# Patient Record
Sex: Female | Born: 1940 | Race: White | Hispanic: No | Marital: Married | Smoking: Former smoker
Health system: Southern US, Community
[De-identification: ages and names within clinical notes are randomized; demographics above are authoritative.]

## PROBLEM LIST (undated history)

## (undated) DIAGNOSIS — K5900 Constipation, unspecified: Secondary | ICD-10-CM

## (undated) DIAGNOSIS — IMO0001 Reserved for inherently not codable concepts without codable children: Secondary | ICD-10-CM

## (undated) DIAGNOSIS — C539 Malignant neoplasm of cervix uteri, unspecified: Secondary | ICD-10-CM

## (undated) DIAGNOSIS — H9190 Unspecified hearing loss, unspecified ear: Secondary | ICD-10-CM

## (undated) DIAGNOSIS — R3129 Other microscopic hematuria: Secondary | ICD-10-CM

## (undated) DIAGNOSIS — Z889 Allergy status to unspecified drugs, medicaments and biological substances status: Secondary | ICD-10-CM

## (undated) DIAGNOSIS — E039 Hypothyroidism, unspecified: Secondary | ICD-10-CM

## (undated) DIAGNOSIS — R413 Other amnesia: Secondary | ICD-10-CM

## (undated) DIAGNOSIS — K219 Gastro-esophageal reflux disease without esophagitis: Secondary | ICD-10-CM

## (undated) DIAGNOSIS — N200 Calculus of kidney: Secondary | ICD-10-CM

## (undated) DIAGNOSIS — I10 Essential (primary) hypertension: Secondary | ICD-10-CM

## (undated) DIAGNOSIS — Z8669 Personal history of other diseases of the nervous system and sense organs: Secondary | ICD-10-CM

## (undated) HISTORY — PX: OTHER SURGICAL HISTORY: SHX169

## (undated) HISTORY — PX: KIDNEY STONE SURGERY: SHX686

## (undated) HISTORY — PX: VAGINAL HYSTERECTOMY: SUR661

---

## 2002-10-31 HISTORY — PX: BREAST CYST ASPIRATION: SHX578

## 2002-10-31 HISTORY — PX: BREAST BIOPSY: SHX20

## 2005-01-20 ENCOUNTER — Ambulatory Visit: Payer: Self-pay | Admitting: Neurology

## 2005-01-25 ENCOUNTER — Encounter: Payer: Self-pay | Admitting: Neurology

## 2005-01-29 ENCOUNTER — Encounter: Payer: Self-pay | Admitting: Neurology

## 2005-02-08 ENCOUNTER — Ambulatory Visit: Payer: Self-pay | Admitting: Internal Medicine

## 2006-03-08 ENCOUNTER — Ambulatory Visit: Payer: Self-pay | Admitting: Internal Medicine

## 2006-03-30 ENCOUNTER — Ambulatory Visit: Payer: Self-pay | Admitting: Unknown Physician Specialty

## 2006-06-29 ENCOUNTER — Ambulatory Visit: Payer: Self-pay

## 2006-07-19 ENCOUNTER — Ambulatory Visit: Payer: Self-pay | Admitting: Unknown Physician Specialty

## 2007-02-27 ENCOUNTER — Ambulatory Visit: Payer: Self-pay

## 2007-04-05 ENCOUNTER — Ambulatory Visit: Payer: Self-pay | Admitting: Surgery

## 2007-12-19 ENCOUNTER — Ambulatory Visit: Payer: Self-pay | Admitting: Unknown Physician Specialty

## 2008-01-23 ENCOUNTER — Ambulatory Visit: Payer: Self-pay | Admitting: Unknown Physician Specialty

## 2008-03-04 ENCOUNTER — Ambulatory Visit: Payer: Self-pay | Admitting: Internal Medicine

## 2008-06-21 ENCOUNTER — Emergency Department: Payer: Self-pay

## 2009-01-28 ENCOUNTER — Ambulatory Visit: Payer: Self-pay | Admitting: Unknown Physician Specialty

## 2009-04-22 ENCOUNTER — Ambulatory Visit: Payer: Self-pay | Admitting: Obstetrics and Gynecology

## 2009-08-07 ENCOUNTER — Ambulatory Visit: Payer: Self-pay | Admitting: Internal Medicine

## 2010-05-24 ENCOUNTER — Ambulatory Visit: Payer: Self-pay | Admitting: Internal Medicine

## 2010-07-19 ENCOUNTER — Ambulatory Visit: Payer: Self-pay | Admitting: Unknown Physician Specialty

## 2010-11-03 ENCOUNTER — Ambulatory Visit: Payer: Self-pay | Admitting: Urology

## 2011-06-27 ENCOUNTER — Ambulatory Visit: Payer: Self-pay | Admitting: Internal Medicine

## 2011-06-29 ENCOUNTER — Ambulatory Visit: Payer: Self-pay | Admitting: Internal Medicine

## 2011-07-06 ENCOUNTER — Ambulatory Visit: Payer: Self-pay | Admitting: Internal Medicine

## 2011-07-21 ENCOUNTER — Ambulatory Visit: Payer: Self-pay | Admitting: Urology

## 2012-03-13 ENCOUNTER — Other Ambulatory Visit (HOSPITAL_COMMUNITY): Payer: Self-pay | Admitting: Urology

## 2012-03-13 DIAGNOSIS — N19 Unspecified kidney failure: Secondary | ICD-10-CM

## 2012-03-16 ENCOUNTER — Encounter (HOSPITAL_COMMUNITY)
Admission: RE | Admit: 2012-03-16 | Discharge: 2012-03-16 | Disposition: A | Discharge status: Home / Self Care | Payer: Medicare Other | Source: Ambulatory Visit | Attending: Urology | Admitting: Urology

## 2012-03-16 DIAGNOSIS — N19 Unspecified kidney failure: Secondary | ICD-10-CM | POA: Insufficient documentation

## 2012-03-16 DIAGNOSIS — R319 Hematuria, unspecified: Secondary | ICD-10-CM | POA: Insufficient documentation

## 2012-03-16 DIAGNOSIS — R109 Unspecified abdominal pain: Secondary | ICD-10-CM | POA: Insufficient documentation

## 2012-03-16 MED ORDER — FUROSEMIDE 10 MG/ML IJ SOLN
40.0000 mg | Freq: Once | INTRAMUSCULAR | Status: DC
  Filled 2012-03-16: qty 4

## 2012-03-16 MED ORDER — TECHNETIUM TC 99M MERTIATIDE
14.9000 | Freq: Once | INTRAVENOUS | Status: AC | PRN
  Administered 2012-03-16: 14.9 via INTRAVENOUS

## 2012-05-08 ENCOUNTER — Other Ambulatory Visit: Payer: Self-pay | Admitting: Urology

## 2012-05-10 ENCOUNTER — Encounter (HOSPITAL_BASED_OUTPATIENT_CLINIC_OR_DEPARTMENT_OTHER): Payer: Self-pay | Admitting: *Deleted

## 2012-05-10 NOTE — Progress Notes (Signed)
NPO AFTER MN WITH EXCEPTION CLEAR LIQUIDS UNTIL 0800 (NO CREAM/ MILK PRODUCTS). ARRIVES AT 1300. NEEDS ISTAT AND EKG. WILL TAKE RANITIDINE AM OF SURG W/ SIP OF WATER.

## 2012-05-16 ENCOUNTER — Ambulatory Visit (HOSPITAL_BASED_OUTPATIENT_CLINIC_OR_DEPARTMENT_OTHER)
Admission: RE | Admit: 2012-05-16 | Discharge: 2012-05-16 | Disposition: A | Discharge status: Home / Self Care | Payer: Medicare Other | Source: Ambulatory Visit | Attending: Urology | Admitting: Urology

## 2012-05-16 ENCOUNTER — Encounter (HOSPITAL_BASED_OUTPATIENT_CLINIC_OR_DEPARTMENT_OTHER): Payer: Self-pay | Admitting: Anesthesiology

## 2012-05-16 ENCOUNTER — Ambulatory Visit (HOSPITAL_BASED_OUTPATIENT_CLINIC_OR_DEPARTMENT_OTHER): Payer: Medicare Other | Admitting: Anesthesiology

## 2012-05-16 ENCOUNTER — Encounter (HOSPITAL_BASED_OUTPATIENT_CLINIC_OR_DEPARTMENT_OTHER)
Admission: RE | Disposition: A | Discharge status: Home / Self Care | Payer: Self-pay | Source: Ambulatory Visit | Attending: Urology

## 2012-05-16 ENCOUNTER — Encounter (HOSPITAL_BASED_OUTPATIENT_CLINIC_OR_DEPARTMENT_OTHER): Payer: Self-pay | Admitting: *Deleted

## 2012-05-16 DIAGNOSIS — R3129 Other microscopic hematuria: Secondary | ICD-10-CM | POA: Insufficient documentation

## 2012-05-16 DIAGNOSIS — I10 Essential (primary) hypertension: Secondary | ICD-10-CM | POA: Insufficient documentation

## 2012-05-16 DIAGNOSIS — K219 Gastro-esophageal reflux disease without esophagitis: Secondary | ICD-10-CM | POA: Insufficient documentation

## 2012-05-16 DIAGNOSIS — Z90710 Acquired absence of both cervix and uterus: Secondary | ICD-10-CM | POA: Insufficient documentation

## 2012-05-16 DIAGNOSIS — E039 Hypothyroidism, unspecified: Secondary | ICD-10-CM | POA: Insufficient documentation

## 2012-05-16 HISTORY — PX: CYSTOSCOPY W/ RETROGRADES: SHX1426

## 2012-05-16 HISTORY — DX: Hypothyroidism, unspecified: E03.9

## 2012-05-16 HISTORY — DX: Unspecified hearing loss, unspecified ear: H91.90

## 2012-05-16 HISTORY — DX: Personal history of other diseases of the nervous system and sense organs: Z86.69

## 2012-05-16 HISTORY — DX: Gastro-esophageal reflux disease without esophagitis: K21.9

## 2012-05-16 HISTORY — DX: Essential (primary) hypertension: I10

## 2012-05-16 HISTORY — DX: Allergy status to unspecified drugs, medicaments and biological substances: Z88.9

## 2012-05-16 HISTORY — DX: Other microscopic hematuria: R31.29

## 2012-05-16 HISTORY — DX: Other amnesia: R41.3

## 2012-05-16 HISTORY — DX: Constipation, unspecified: K59.00

## 2012-05-16 HISTORY — DX: Reserved for inherently not codable concepts without codable children: IMO0001

## 2012-05-16 SURGERY — CYSTOSCOPY, WITH RETROGRADE PYELOGRAM
Anesthesia: General | Site: Ureter | Laterality: Bilateral | Wound class: Clean Contaminated

## 2012-05-16 MED ORDER — LIDOCAINE HCL 2 % EX GEL
CUTANEOUS | Status: DC | PRN
  Administered 2012-05-16: 1 via URETHRAL

## 2012-05-16 MED ORDER — IOHEXOL 350 MG/ML SOLN
INTRAVENOUS | Status: DC | PRN
  Administered 2012-05-16: 23 mL via INTRAVENOUS

## 2012-05-16 MED ORDER — ONDANSETRON HCL 4 MG/2ML IJ SOLN
INTRAMUSCULAR | Status: DC | PRN
  Administered 2012-05-16: 4 mg via INTRAVENOUS

## 2012-05-16 MED ORDER — DEXAMETHASONE SODIUM PHOSPHATE 4 MG/ML IJ SOLN
INTRAMUSCULAR | Status: DC | PRN
  Administered 2012-05-16: 10 mg via INTRAVENOUS

## 2012-05-16 MED ORDER — PROMETHAZINE HCL 25 MG/ML IJ SOLN: 6.2500 mg | INTRAMUSCULAR | Status: DC | PRN

## 2012-05-16 MED ORDER — PROPOFOL 10 MG/ML IV EMUL
INTRAVENOUS | Status: DC | PRN
  Administered 2012-05-16: 150 mg via INTRAVENOUS

## 2012-05-16 MED ORDER — EPHEDRINE SULFATE 50 MG/ML IJ SOLN
INTRAMUSCULAR | Status: DC | PRN
  Administered 2012-05-16 (×2): 10 mg via INTRAVENOUS

## 2012-05-16 MED ORDER — FENTANYL CITRATE 0.05 MG/ML IJ SOLN: 25.0000 ug | INTRAMUSCULAR | Status: DC | PRN

## 2012-05-16 MED ORDER — PHENAZOPYRIDINE HCL 100 MG PO TABS: 100.0000 mg | ORAL_TABLET | Freq: Three times a day (TID) | ORAL | Status: AC | PRN

## 2012-05-16 MED ORDER — CEPHALEXIN 500 MG PO CAPS: 500.0000 mg | ORAL_CAPSULE | Freq: Three times a day (TID) | ORAL | Status: AC

## 2012-05-16 MED ORDER — LIDOCAINE HCL (CARDIAC) 20 MG/ML IV SOLN
INTRAVENOUS | Status: DC | PRN
  Administered 2012-05-16: 50 mg via INTRAVENOUS

## 2012-05-16 MED ORDER — LACTATED RINGERS IV SOLN
INTRAVENOUS | Status: DC
  Administered 2012-05-16: 13:00:00 via INTRAVENOUS

## 2012-05-16 MED ORDER — HYDROCODONE-ACETAMINOPHEN 5-325 MG PO TABS: 1.0000 | ORAL_TABLET | ORAL | Status: AC | PRN

## 2012-05-16 MED ORDER — KETOROLAC TROMETHAMINE 30 MG/ML IJ SOLN: 15.0000 mg | Freq: Once | INTRAMUSCULAR | Status: DC | PRN

## 2012-05-16 MED ORDER — FENTANYL CITRATE 0.05 MG/ML IJ SOLN
INTRAMUSCULAR | Status: DC | PRN
  Administered 2012-05-16: 25 ug via INTRAVENOUS
  Administered 2012-05-16: 50 ug via INTRAVENOUS
  Administered 2012-05-16: 25 ug via INTRAVENOUS

## 2012-05-16 MED ORDER — STERILE WATER FOR IRRIGATION IR SOLN
Status: DC | PRN
  Administered 2012-05-16: 3000 mL

## 2012-05-16 MED ORDER — BELLADONNA ALKALOIDS-OPIUM 16.2-60 MG RE SUPP
RECTAL | Status: DC | PRN
  Administered 2012-05-16: 1 via RECTAL

## 2012-05-16 MED ORDER — CEFAZOLIN SODIUM 1-5 GM-% IV SOLN
1.0000 g | INTRAVENOUS | Status: AC
  Administered 2012-05-16: 1 g via INTRAVENOUS

## 2012-05-16 MED ORDER — HYOSCYAMINE SULFATE 0.125 MG PO TABS: 0.1250 mg | ORAL_TABLET | ORAL | Status: DC | PRN

## 2012-05-16 SURGICAL SUPPLY — 35 items
ADAPTER CATH URET PLST 4-6FR (CATHETERS) ×2 IMPLANT
BAG DRAIN URO-CYSTO SKYTR STRL (DRAIN) ×2 IMPLANT
BASKET LASER NITINOL 1.9FR (BASKET) IMPLANT
BASKET STNLS GEMINI 4WIRE 3FR (BASKET) IMPLANT
BASKET ZERO TIP NITINOL 2.4FR (BASKET) IMPLANT
BRUSH URET BIOPSY 3F (UROLOGICAL SUPPLIES) IMPLANT
CANISTER SUCT LVC 12 LTR MEDI- (MISCELLANEOUS) ×2 IMPLANT
CATH INTERMIT  6FR 70CM (CATHETERS) IMPLANT
CATH URET 5FR 28IN CONE TIP (BALLOONS)
CATH URET 5FR 28IN OPEN ENDED (CATHETERS) ×2 IMPLANT
CATH URET 5FR 70CM CONE TIP (BALLOONS) IMPLANT
CLOTH BEACON ORANGE TIMEOUT ST (SAFETY) ×2 IMPLANT
DRAPE CAMERA CLOSED 9X96 (DRAPES) ×2 IMPLANT
ELECT REM PT RETURN 9FT ADLT (ELECTROSURGICAL)
ELECTRODE REM PT RTRN 9FT ADLT (ELECTROSURGICAL) IMPLANT
GLOVE BIOGEL M 6.5 STRL (GLOVE) ×2 IMPLANT
GLOVE BIOGEL PI IND STRL 6.5 (GLOVE) ×1 IMPLANT
GLOVE BIOGEL PI INDICATOR 6.5 (GLOVE) ×1
GLOVE ECLIPSE 7.0 STRL STRAW (GLOVE) ×2 IMPLANT
GLOVE INDICATOR 6.5 STRL GRN (GLOVE) ×2 IMPLANT
GLOVE INDICATOR 7.5 STRL GRN (GLOVE) ×2 IMPLANT
GOWN PREVENTION PLUS LG XLONG (DISPOSABLE) ×2 IMPLANT
GUIDEWIRE 0.038 PTFE COATED (WIRE) IMPLANT
GUIDEWIRE ANG ZIPWIRE 038X150 (WIRE) IMPLANT
GUIDEWIRE STR DUAL SENSOR (WIRE) ×2 IMPLANT
IV NS IRRIG 3000ML ARTHROMATIC (IV SOLUTION) ×2 IMPLANT
KIT BALLIN UROMAX 15FX10 (LABEL) IMPLANT
KIT BALLN UROMAX 15FX4 (MISCELLANEOUS) IMPLANT
KIT BALLN UROMAX 26 75X4 (MISCELLANEOUS)
LASER FIBER DISP (UROLOGICAL SUPPLIES) IMPLANT
PACK CYSTOSCOPY (CUSTOM PROCEDURE TRAY) ×2 IMPLANT
SET HIGH PRES BAL DIL (LABEL)
SHEATH URET ACCESS 12FR/35CM (UROLOGICAL SUPPLIES) IMPLANT
SHEATH URET ACCESS 12FR/55CM (UROLOGICAL SUPPLIES) IMPLANT
SYRINGE IRR TOOMEY STRL 70CC (SYRINGE) IMPLANT

## 2012-05-16 NOTE — Transfer of Care (Signed)
Immediate Anesthesia Transfer of Care Note  Patient: Emma Walls  Procedure(s) Performed: Procedure(s) (LRB): CYSTOSCOPY WITH RETROGRADE PYELOGRAM (Bilateral)  Patient Location: PACU  Anesthesia Type: General  Level of Consciousness: awake, alert  and oriented  Airway & Oxygen Therapy: Patient Spontanous Breathing and Patient connected to face mask oxygen  Post-op Assessment: Report given to PACU RN and Post -op Vital signs reviewed and stable  Post vital signs: Reviewed and stable  Complications: No apparent anesthesia complications

## 2012-05-16 NOTE — Progress Notes (Signed)
Istat result K 5.8 Dr. Kalman Shan notified and ok'd lab result.

## 2012-05-16 NOTE — Anesthesia Preprocedure Evaluation (Signed)
Anesthesia Evaluation  Patient identified by MRN, date of birth, ID band Patient awake    Reviewed: Allergy & Precautions, H&P , NPO status , Patient's Chart, lab work & pertinent test results  Airway Mallampati: II TM Distance: <3 FB Neck ROM: Full    Dental No notable dental hx.    Pulmonary neg pulmonary ROS,  breath sounds clear to auscultation  Pulmonary exam normal       Cardiovascular hypertension, Pt. on medications Rhythm:Regular Rate:Normal     Neuro/Psych negative neurological ROS  negative psych ROS   GI/Hepatic Neg liver ROS, GERD-  Medicated,  Endo/Other  negative endocrine ROS  Renal/GU negative Renal ROS  negative genitourinary   Musculoskeletal negative musculoskeletal ROS (+)   Abdominal   Peds negative pediatric ROS (+)  Hematology negative hematology ROS (+)   Anesthesia Other Findings   Reproductive/Obstetrics negative OB ROS                           Anesthesia Physical Anesthesia Plan  ASA: II  Anesthesia Plan: General   Post-op Pain Management:    Induction: Intravenous  Airway Management Planned: LMA  Additional Equipment:   Intra-op Plan:   Post-operative Plan:   Informed Consent: I have reviewed the patients History and Physical, chart, labs and discussed the procedure including the risks, benefits and alternatives for the proposed anesthesia with the patient or authorized representative who has indicated his/her understanding and acceptance.   Dental advisory given  Plan Discussed with: CRNA and Surgeon  Anesthesia Plan Comments:         Anesthesia Quick Evaluation

## 2012-05-16 NOTE — H&P (Signed)
Urology History and Physical Exam  CC: Microscopic hematuria  HPI: 71 year old female presentss today for cystoscopy and right retrograde pyelogram.  On work up for microscopic hematuria her CT from 04/24/12 revealed incomplete opacification of her right distal ureter. The left ureter filled out well. Office cystoscopy in May 2013 was negative for tumors. She had some acute renal insufficiency that seems to be resolving. This was not associated with right hydroureter or right hydronephrosis. We have discussed the risks, benefits, alternatives, and likelihood of achieving goals. There was no nephrolithiasis, hydroureter, or filling defects bilaterally.  PMH: Past Medical History  Diagnosis Date  . Hypertension   . H/O seasonal allergies   . History of migraine   . Mild memory disturbance OCCASIONAL FORGETFULNESS  . Hypothyroidism   . GERD (gastroesophageal reflux disease)   . Constipation   . Microhematuria   . Impaired hearing BILATERAL HEARING AIDS    PSH: Past Surgical History  Procedure Date  . Vaginal hysterectomy AGE 71  . Benign breast bx 2007  (APPROX)    Allergies: Allergies  Allergen Reactions  . Codeine Other (See Comments)    HALLUCINATIONS  . Sulfa Antibiotics Other (See Comments)    UNKNOWN REACTION    Medications: No prescriptions prior to admission     Social History: History   Social History  . Marital Status: Married    Spouse Name: N/A    Number of Children: N/A  . Years of Education: N/A   Occupational History  . Not on file.   Social History Main Topics  . Smoking status: Never Smoker   . Smokeless tobacco: Never Used  . Alcohol Use: Yes     SOCIAL  . Drug Use: No  . Sexually Active:    Other Topics Concern  . Not on file   Social History Narrative  . No narrative on file    Family History: History reviewed. No pertinent family history.  Review of Systems: Positive: None. Negative: Fever, chest pain, SOB.  A further 10 point  review of systems was negative except what is listed in the HPI.  Physical Exam:  General: No acute distress.  Awake. Head:  Normocephalic.  Atraumatic. ENT:  EOMI.  Mucous membranes moist Neck:  Supple.  No lymphadenopathy. CV:  S1 present. S2 present. Regular rate. Pulmonary: Equal effort bilaterally.  Clear to auscultation bilaterally. Abdomen: Soft.  Non- tender to palpation. Skin:  Normal turgor.  No visible rash. Extremity: No gross deformity of bilateral upper extremities.  No gross deformity of    bilateral lower extremities. Neurologic: Alert. Appropriate mood.   Studies:  No results found for this basename: HGB:2,WBC:2,PLT:2 in the last 72 hours  No results found for this basename: NA:2,K:2,CL:2,CO2:2,BUN:2,CREATININE:2,CALCIUM:2,MAGNESIUM:2,GFRNONAA:2,GFRAA:2 in the last 72 hours   No results found for this basename: PT:2,INR:2,APTT:2 in the last 72 hours   No components found with this basename: ABG:2    Assessment:  Microscopic hematuria  Plan: -To OR for cystoscopy and right retrograde pyelogram.

## 2012-05-16 NOTE — Brief Op Note (Signed)
05/16/2012  3:02 PM  PATIENT:  Emma Walls  71 y.o. female  PRE-OPERATIVE DIAGNOSIS:  MICROSCOPIC HEMATURIA  POST-OPERATIVE DIAGNOSIS:  MICROSCOPIC HEMATURIA  PROCEDURE:  Procedure(s) (LRB): CYSTOSCOPY WITH RETROGRADE PYELOGRAM (Bilateral)  SURGEON:  Surgeon(s) and Role:    * Molli Hazard, MD - Primary  PHYSICIAN ASSISTANT:   ASSISTANTS: none   ANESTHESIA:   general  EBL:   None  BLOOD ADMINISTERED:none  DRAINS: none   LOCAL MEDICATIONS USED:  Amount: 10 ml urethral lidocaine jelly and OTHER B&O suppository.  SPECIMEN:  No Specimen  DISPOSITION OF SPECIMEN:  N/A  COUNTS:  YES  TOURNIQUET:  * No tourniquets in log *  DICTATION: .Other Dictation: Dictation Number [id number]  PLAN OF CARE: Discharge to home after PACU  PATIENT DISPOSITION:  PACU - hemodynamically stable.   Delay start of Pharmacological VTE agent (>24hrs) due to surgical blood loss or risk of bleeding: no

## 2012-05-17 ENCOUNTER — Encounter (HOSPITAL_BASED_OUTPATIENT_CLINIC_OR_DEPARTMENT_OTHER): Payer: Self-pay | Admitting: Urology

## 2012-05-17 LAB — POCT I-STAT 4, (NA,K, GLUC, HGB,HCT): Hemoglobin: 13.6 g/dL (ref 12.0–15.0)

## 2012-05-17 NOTE — Op Note (Signed)
NAME:  Walls, Emma                         ACCOUNT NO.:  MEDICAL RECORD NO.:  [id number]  LOCATION:                                 FACILITY:  PHYSICIAN:  Rolan Bucco, MD    DATE OF BIRTH:  02/10/1941  DATE OF PROCEDURE:05/16/12 DATE OF DISCHARGE:                              OPERATIVE REPORT   SURGEON:  Rolan Bucco, MD  ASSISTANT:  None.  PREOPERATIVE DIAGNOSIS:  Microscopic hematuria.  POSTOPERATIVE DIAGNOSIS:  Microscopic hematuria.  PROCEDURES PERFORMED:  Cystoscopy, panendoscopy, and bilateral retrograde pyelogram.  FINDINGS:  No filling defects bilaterally.  No tumors in the bladder.  COMPLICATIONS:  None.  SPECIMEN:  None.  DRAINS:  None.  HISTORY OF PRESENT ILLNESS:  This is a 71 year old female who was worked up for microscopic hematuria.  CT scan revealed incomplete opacification of the right distal ureter.  She presents today for evaluation of this right distal ureter.  PROCEDURE:  Informed consent was obtained.  The patient was taken to the operating room where she was placed in supine position.  IV antibiotics were infused and general anesthesia was induced.  She was then placed in a dorsal lithotomy position making sure to pad all pertinent neurovascular pressure points appropriately.  SCDs were in place and turned on prior to the induction of anesthesia.  She was placed in dorsal lithotomy position making sure to pad all pertinent neurovascular pressure points appropriately.  Her genitals were prepped and draped in usual sterile fashion.  Belladonna and opium suppository was placed into her rectum.  Time-out was performed which the correct patient, surgical site, and procedure were identified and agreed upon by the team.  Following this, a cystoscope was advanced through the urethra into the bladder.  The right ureteral orifice was cannulated with a 5- [place] ureteral catheter and retrograde pyelogram was obtained when I injected contrast into this  ureter.  There were no filling defects in the ureter or the collecting system.  The collecting system did appear to empty out well.  The bladder was evaluated in systematic fashion with the 12-degree and 70-degree lens and distending the bladder to ensure the entire surface was visualized, and there were no lesions noted.  Because she had microscopic hematuria, I did feel that it would be appropriate to perform a left retrograde pyelogram as this is a more sensitive test sometimes than the CT scan. Therefore, I cannulated the left ureteral orifice, and shot a retrograde pyelogram by injecting contrast through the ureteral catheter.  There was no filling defects in the ureter or the renal collecting system.  This system emptied out well.  The bladder was drained and 10 mL of urethral lidocaine jelly were placed into the urethra.  This completed the procedure.  She was placed back in supine position. Anesthesia was reversed.  She was taken to PACU in stable condition.          ______________________________ Rolan Bucco, MD     DW/MEDQ  D:  05/16/2012  T:  05/17/2012  Job:  [id number]

## 2012-05-27 NOTE — Anesthesia Postprocedure Evaluation (Signed)
  Anesthesia Post-op Note  Patient: Emma Walls  Procedure(s) Performed: Procedure(s) (LRB): CYSTOSCOPY WITH RETROGRADE PYELOGRAM (Bilateral)  Patient Location: PACU  Anesthesia Type: General  Level of Consciousness: awake and alert   Airway and Oxygen Therapy: Patient Spontanous Breathing  Post-op Pain: mild  Post-op Assessment: Post-op Vital signs reviewed, Patient's Cardiovascular Status Stable, Respiratory Function Stable, Patent Airway and No signs of Nausea or vomiting  Post-op Vital Signs: stable  Complications: No apparent anesthesia complications

## 2012-06-27 ENCOUNTER — Ambulatory Visit: Payer: Self-pay | Admitting: Internal Medicine

## 2013-04-12 ENCOUNTER — Other Ambulatory Visit (HOSPITAL_COMMUNITY): Payer: Self-pay | Admitting: Urology

## 2013-04-12 ENCOUNTER — Other Ambulatory Visit (HOSPITAL_COMMUNITY): Payer: Self-pay | Admitting: Diagnostic Radiology

## 2013-04-12 DIAGNOSIS — N281 Cyst of kidney, acquired: Secondary | ICD-10-CM

## 2013-04-15 ENCOUNTER — Ambulatory Visit: Payer: Self-pay | Admitting: Unknown Physician Specialty

## 2013-04-17 LAB — PATHOLOGY REPORT

## 2013-05-08 ENCOUNTER — Ambulatory Visit (HOSPITAL_COMMUNITY)
Admission: RE | Admit: 2013-05-08 | Discharge: 2013-05-08 | Disposition: A | Discharge status: Home / Self Care | Payer: Medicare Other | Source: Ambulatory Visit | Attending: Urology | Admitting: Urology

## 2013-05-08 ENCOUNTER — Other Ambulatory Visit (HOSPITAL_COMMUNITY): Payer: Medicare Other

## 2013-05-08 ENCOUNTER — Other Ambulatory Visit (HOSPITAL_COMMUNITY): Payer: Self-pay | Admitting: Urology

## 2013-05-08 DIAGNOSIS — N289 Disorder of kidney and ureter, unspecified: Secondary | ICD-10-CM | POA: Insufficient documentation

## 2013-05-08 DIAGNOSIS — N281 Cyst of kidney, acquired: Secondary | ICD-10-CM

## 2013-05-08 LAB — CREATININE, SERUM
Creatinine, Ser: 1.49 mg/dL — ABNORMAL HIGH (ref 0.50–1.10)
GFR calc Af Amer: 40 mL/min — ABNORMAL LOW (ref >90)
GFR calc non Af Amer: 34 mL/min — ABNORMAL LOW (ref >90)

## 2013-05-24 ENCOUNTER — Ambulatory Visit: Payer: Self-pay | Admitting: Unknown Physician Specialty

## 2013-05-28 LAB — PATHOLOGY REPORT

## 2013-07-03 ENCOUNTER — Ambulatory Visit: Payer: Self-pay | Admitting: Internal Medicine

## 2014-01-14 ENCOUNTER — Ambulatory Visit: Payer: Self-pay | Admitting: Unknown Physician Specialty

## 2014-08-27 ENCOUNTER — Ambulatory Visit: Payer: Self-pay | Admitting: Internal Medicine

## 2015-03-10 ENCOUNTER — Other Ambulatory Visit: Payer: Self-pay | Admitting: Internal Medicine

## 2015-03-10 DIAGNOSIS — R55 Syncope and collapse: Secondary | ICD-10-CM

## 2015-03-17 ENCOUNTER — Ambulatory Visit
Admission: RE | Admit: 2015-03-17 | Discharge: 2015-03-17 | Disposition: A | Discharge status: Home / Self Care | Payer: Medicare Other | Source: Ambulatory Visit | Attending: Internal Medicine | Admitting: Internal Medicine

## 2015-03-17 DIAGNOSIS — R55 Syncope and collapse: Secondary | ICD-10-CM

## 2015-03-17 DIAGNOSIS — I6523 Occlusion and stenosis of bilateral carotid arteries: Secondary | ICD-10-CM | POA: Diagnosis not present

## 2015-03-28 ENCOUNTER — Emergency Department
Admission: EM | Admit: 2015-03-28 | Discharge: 2015-03-29 | Disposition: A | Discharge status: Home / Self Care | Payer: Medicare Other | Source: Home / Self Care | Attending: Emergency Medicine | Admitting: Emergency Medicine

## 2015-03-28 ENCOUNTER — Encounter: Payer: Self-pay | Admitting: Emergency Medicine

## 2015-03-28 DIAGNOSIS — N12 Tubulo-interstitial nephritis, not specified as acute or chronic: Principal | ICD-10-CM | POA: Diagnosis present

## 2015-03-28 DIAGNOSIS — E039 Hypothyroidism, unspecified: Secondary | ICD-10-CM | POA: Diagnosis present

## 2015-03-28 DIAGNOSIS — I129 Hypertensive chronic kidney disease with stage 1 through stage 4 chronic kidney disease, or unspecified chronic kidney disease: Secondary | ICD-10-CM | POA: Insufficient documentation

## 2015-03-28 DIAGNOSIS — Z79899 Other long term (current) drug therapy: Secondary | ICD-10-CM | POA: Insufficient documentation

## 2015-03-28 DIAGNOSIS — Z882 Allergy status to sulfonamides status: Secondary | ICD-10-CM

## 2015-03-28 DIAGNOSIS — N183 Chronic kidney disease, stage 3 (moderate): Secondary | ICD-10-CM | POA: Diagnosis present

## 2015-03-28 DIAGNOSIS — E871 Hypo-osmolality and hyponatremia: Secondary | ICD-10-CM | POA: Diagnosis present

## 2015-03-28 DIAGNOSIS — Z7982 Long term (current) use of aspirin: Secondary | ICD-10-CM | POA: Insufficient documentation

## 2015-03-28 DIAGNOSIS — Z66 Do not resuscitate: Secondary | ICD-10-CM | POA: Diagnosis present

## 2015-03-28 DIAGNOSIS — Z87891 Personal history of nicotine dependence: Secondary | ICD-10-CM | POA: Insufficient documentation

## 2015-03-28 DIAGNOSIS — N189 Chronic kidney disease, unspecified: Secondary | ICD-10-CM | POA: Insufficient documentation

## 2015-03-28 DIAGNOSIS — Z87442 Personal history of urinary calculi: Secondary | ICD-10-CM

## 2015-03-28 DIAGNOSIS — K219 Gastro-esophageal reflux disease without esophagitis: Secondary | ICD-10-CM | POA: Diagnosis present

## 2015-03-28 DIAGNOSIS — R112 Nausea with vomiting, unspecified: Secondary | ICD-10-CM | POA: Diagnosis not present

## 2015-03-28 DIAGNOSIS — Z6827 Body mass index (BMI) 27.0-27.9, adult: Secondary | ICD-10-CM

## 2015-03-28 DIAGNOSIS — H9190 Unspecified hearing loss, unspecified ear: Secondary | ICD-10-CM | POA: Diagnosis present

## 2015-03-28 LAB — URINALYSIS COMPLETE WITH MICROSCOPIC (ARMC ONLY)
Bilirubin Urine: NEGATIVE
Glucose, UA: NEGATIVE mg/dL
KETONES UR: NEGATIVE mg/dL
NITRITE: POSITIVE — AB
PH: 5 (ref 5.0–8.0)
PROTEIN: 100 mg/dL — AB
Specific Gravity, Urine: 1.014 (ref 1.005–1.030)

## 2015-03-28 LAB — COMPREHENSIVE METABOLIC PANEL
ALBUMIN: 3.6 g/dL (ref 3.5–5.0)
ALT: 99 U/L — ABNORMAL HIGH (ref 14–54)
AST: 127 U/L — AB (ref 15–41)
Alkaline Phosphatase: 107 U/L (ref 38–126)
Anion gap: 11 (ref 5–15)
BUN: 31 mg/dL — ABNORMAL HIGH (ref 6–20)
CALCIUM: 9.4 mg/dL (ref 8.9–10.3)
CO2: 22 mmol/L (ref 22–32)
CREATININE: 1.81 mg/dL — AB (ref 0.44–1.00)
Chloride: 100 mmol/L — ABNORMAL LOW (ref 101–111)
GFR calc non Af Amer: 27 mL/min — ABNORMAL LOW (ref >60)
GFR, EST AFRICAN AMERICAN: 31 mL/min — AB (ref >60)
Glucose, Bld: 140 mg/dL — ABNORMAL HIGH (ref 65–99)
POTASSIUM: 4.1 mmol/L (ref 3.5–5.1)
Sodium: 133 mmol/L — ABNORMAL LOW (ref 135–145)
TOTAL PROTEIN: 7.2 g/dL (ref 6.5–8.1)
Total Bilirubin: 0.8 mg/dL (ref 0.3–1.2)

## 2015-03-28 LAB — CBC WITH DIFFERENTIAL/PLATELET
Basophils Absolute: 0.1 10*3/uL (ref 0–0.1)
Basophils Relative: 0 %
EOS PCT: 0 %
Eosinophils Absolute: 0.1 10*3/uL (ref 0–0.7)
HEMATOCRIT: 34.6 % — AB (ref 35.0–47.0)
Hemoglobin: 11.9 g/dL — ABNORMAL LOW (ref 12.0–16.0)
LYMPHS PCT: 6 %
Lymphs Abs: 0.9 10*3/uL — ABNORMAL LOW (ref 1.0–3.6)
MCH: 32.6 pg (ref 26.0–34.0)
MCHC: 34.4 g/dL (ref 32.0–36.0)
MCV: 95 fL (ref 80.0–100.0)
MONOS PCT: 8 %
Monocytes Absolute: 1.1 10*3/uL — ABNORMAL HIGH (ref 0.2–0.9)
NEUTROS PCT: 86 %
Neutro Abs: 11.5 10*3/uL — ABNORMAL HIGH (ref 1.4–6.5)
PLATELETS: 202 10*3/uL (ref 150–440)
RBC: 3.64 MIL/uL — ABNORMAL LOW (ref 3.80–5.20)
RDW: 12.1 % (ref 11.5–14.5)
WBC: 13.6 10*3/uL — ABNORMAL HIGH (ref 3.6–11.0)

## 2015-03-28 LAB — LIPASE, BLOOD: Lipase: 51 U/L (ref 22–51)

## 2015-03-28 MED ORDER — MORPHINE SULFATE 4 MG/ML IJ SOLN: 4.0000 mg | Freq: Once | INTRAMUSCULAR | Status: DC

## 2015-03-28 MED ORDER — MORPHINE SULFATE 2 MG/ML IJ SOLN
INTRAMUSCULAR | Status: AC
  Administered 2015-03-28: 4 mg via INTRAVENOUS
  Filled 2015-03-28: qty 2

## 2015-03-28 MED ORDER — CEFTRIAXONE SODIUM IN DEXTROSE 40 MG/ML IV SOLN
2.0000 g | Freq: Once | INTRAVENOUS | Status: AC
  Administered 2015-03-28: 2 g via INTRAVENOUS
  Filled 2015-03-28: qty 50

## 2015-03-28 MED ORDER — SODIUM CHLORIDE 0.9 % IV BOLUS (SEPSIS)
1000.0000 mL | Freq: Once | INTRAVENOUS | Status: AC
  Administered 2015-03-28: 1000 mL via INTRAVENOUS

## 2015-03-28 MED ORDER — ONDANSETRON HCL 4 MG/2ML IJ SOLN
4.0000 mg | Freq: Once | INTRAMUSCULAR | Status: AC
  Administered 2015-03-28: 4 mg via INTRAVENOUS

## 2015-03-28 MED ORDER — ONDANSETRON HCL 4 MG/2ML IJ SOLN
INTRAMUSCULAR | Status: AC
  Administered 2015-03-28: 4 mg via INTRAVENOUS
  Filled 2015-03-28: qty 2

## 2015-03-28 NOTE — ED Provider Notes (Signed)
[facility] Emergency Department Provider Note  ____________________________________________  Time seen: Approximately 11:26 PM  I have reviewed the triage vital signs and the nursing notes.   HISTORY  Chief Complaint Flank Pain    HPI Aleane Perini is a 74 y.o. female with a history of hypertension and chronic kidney disease presents with 5 days of burning upon urination. Was seen at the clinic yesterday and prescribed Cipro for her UTI. Has had a persistent fever since despite 4 doses of antibiotics. Also now with back pain bilaterally to the upper and lower lumbar regions. Has had urinary tract infections in the past which have been resolved with Cipro.   Past Medical History  Diagnosis Date  . Hypertension   . H/O seasonal allergies   . History of migraine   . Mild memory disturbance OCCASIONAL FORGETFULNESS  . Hypothyroidism   . GERD (gastroesophageal reflux disease)   . Constipation   . Microhematuria   . Impaired hearing BILATERAL HEARING AIDS    There are no active problems to display for this patient.   Past Surgical History  Procedure Laterality Date  . Vaginal hysterectomy  AGE 49  . Benign breast bx  2007  (APPROX)  . Cystoscopy w/ retrogrades  05/16/2012    Procedure: CYSTOSCOPY WITH RETROGRADE PYELOGRAM;  Surgeon: Molli Hazard, MD;  Location: [facility];  Service: Urology;  Laterality: Bilateral;  45 MINUTES    . 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 Kidney stone surgery      Current Outpatient Rx  Name  Route  Sig  Dispense  Refill  . aspirin 81 MG tablet   Oral   Take 81 mg by mouth daily.         . cetirizine (ZYRTEC) 10 MG tablet   Oral   Take 10 mg by mouth as needed.         . COCONUT OIL PO   Oral   Take by mouth daily.         Marland Kitchen docusate sodium (COLACE) 100 MG capsule   Oral   Take 200 mg by mouth daily.         Marland Kitchen donepezil (ARICEPT) 5 MG tablet   Oral   Take 5 mg by mouth at  bedtime.         Marland Kitchen EXPIRED: hyoscyamine (LEVSIN, ANASPAZ) 0.125 MG tablet   Oral   Take 1 tablet (0.125 mg total) by mouth every 4 (four) hours as needed for cramping (bladder spasms).   40 tablet   4   . levothyroxine (SYNTHROID, LEVOTHROID) 88 MCG tablet   Oral   Take 88 mcg by mouth every evening.         Marland Kitchen losartan (COZAAR) 50 MG tablet   Oral   Take 50 mg by mouth daily.         Marland Kitchen OVER THE COUNTER MEDICATION   Oral   Take by mouth daily. VEMMA LIQUID VITAMIN/ MINERAL SUPP.         . ranitidine (ZANTAC) 300 MG capsule   Oral   Take 300 mg by mouth 2 (two) times daily.         . Sucralfate (CARAFATE PO)   Oral   Take by mouth as needed.           Allergies Codeine; Statins; and Sulfa antibiotics  No family history on file.  Social History History  Substance Use Topics  . Smoking status: Former Research scientist (life sciences)  .  Smokeless tobacco: Never Used  . Alcohol Use: 3.6 oz/week    6 Standard drinks or equivalent per week     Comment: SOCIAL    Review of Systems Constitutional: Fever  Eyes: No visual changes. ENT: No sore throat. Cardiovascular: Denies chest pain. Respiratory: Denies shortness of breath. Gastrointestinal: No abdominal pain.  No nausea, no vomiting.  No diarrhea.  No constipation. Genitourinary: Negative for dysuria. Musculoskeletal: Neck pain  Skin: Negative for rash. Neurological: Negative for headaches, focal weakness or numbness.  10-point ROS otherwise negative.  ____________________________________________   PHYSICAL EXAM:  VITAL SIGNS: ED Triage Vitals  Enc Vitals Group     BP 03/28/15 2119 102/51 mmHg     Pulse Rate 03/28/15 2119 95     Resp 03/28/15 2119 20     Temp 03/28/15 2119 97.9 F (36.6 C)     Temp Source 03/28/15 2119 Oral     SpO2 03/28/15 2119 96 %     Weight 03/28/15 2119 138 lb (62.596 kg)     Height 03/28/15 2119 5\' 2"  (1.575 m)     Head Cir --      Peak Flow --      Pain Score 03/28/15 2140 9     Pain  Loc --      Pain Edu? --      Excl. in [place]? --     Constitutional: Alert and oriented. Well appearing and in no acute distress. Eyes: Conjunctivae are normal. PERRL. EOMI. Head: Atraumatic. Nose: No congestion/rhinnorhea. Mouth/Throat: Mucous membranes are moist.  Oropharynx non-erythematous. Neck: No stridor.   Cardiovascular: Normal rate, regular rhythm. Grossly normal heart sounds.  Good peripheral circulation. Respiratory: Normal respiratory effort.  No retractions. Lungs CTAB. Gastrointestinal: Tender to the suprapubic region. Soft . No distention. No abdominal bruits. CVA tenderness bilaterally. Musculoskeletal: No lower extremity tenderness nor edema.  No joint effusions. Neurologic:  Normal speech and language. No gross focal neurologic deficits are appreciated. Speech is normal. No gait instability. Skin:  Skin is warm, dry and intact. No rash noted. Psychiatric: Mood and affect are normal. Speech and behavior are normal.  ____________________________________________   LABS (all labs ordered are listed, but only abnormal results are displayed)  Labs Reviewed  CBC WITH DIFFERENTIAL/PLATELET - Abnormal; Notable for the following:    WBC 13.6 (*)    RBC 3.64 (*)    Hemoglobin 11.9 (*)    HCT 34.6 (*)    Neutro Abs 11.5 (*)    Lymphs Abs 0.9 (*)    Monocytes Absolute 1.1 (*)    All other components within normal limits  COMPREHENSIVE METABOLIC PANEL - Abnormal; Notable for the following:    Sodium 133 (*)    Chloride 100 (*)    Glucose, Bld 140 (*)    BUN 31 (*)    Creatinine, Ser 1.81 (*)    AST 127 (*)    ALT 99 (*)    GFR calc non Af Amer 27 (*)    GFR calc Af Amer 31 (*)    All other components within normal limits  URINALYSIS COMPLETEWITH MICROSCOPIC ([facility] ONLY) - Abnormal; Notable for the following:    Color, Urine AMBER (*)    APPearance CLOUDY (*)    Hgb urine dipstick 2+ (*)    Protein, ur 100 (*)    Nitrite POSITIVE (*)    Leukocytes, UA 3+ (*)     Bacteria, UA MANY (*)    Squamous Epithelial / LPF 0-5 (*)  All other components within normal limits  LIPASE, BLOOD   ____________________________________________  EKG   ____________________________________________  RADIOLOGY   ____________________________________________   PROCEDURES  ____________________________________________   INITIAL IMPRESSION / ASSESSMENT AND PLAN / ED COURSE  Pertinent labs & imaging results that were available during my care of the patient were reviewed by me and considered in my medical decision making (see chart for details).  Patient with 4 doses of by mouth antibiotics. Given morphine and Zofran in triage. Summary further pain medications. Knows about her chronic kidney disease. We'll give IV fluids and dose of IV antibiotics. We'll reassess patient after fluids and antibiotics. Well appearing. Discussed with family possibly disposition to home if feeling well after antibiotics. We'll likely send home on by mouth cephalosporin chooses to be dispositioned to home.  ----------------------------------------- 12:54 AM on 03/29/2015 -----------------------------------------  Patient says feeling improved. Says would like to go home.We'll change antibiotics to Ceftin and discharge. Patient to stop taking Cipro. ____________________________________________   FINAL CLINICAL IMPRESSION(S) / ED DIAGNOSES  Acute pyelonephritis. Initial visit.     Orbie Pyo, MD 03/29/15 [phone]

## 2015-03-29 MED ORDER — CEFUROXIME AXETIL 500 MG PO TABS: 500.0000 mg | ORAL_TABLET | Freq: Two times a day (BID) | ORAL | Status: DC

## 2015-03-29 NOTE — Discharge Instructions (Signed)
Pyelonephritis, Adult °Pyelonephritis is a kidney infection. In general, there are 2 main types of pyelonephritis: °· Infections that come on quickly without any warning (acute pyelonephritis). °· Infections that persist for a long period of time (chronic pyelonephritis). °CAUSES  °Two main causes of pyelonephritis are: °· Bacteria traveling from the bladder to the kidney. This is a problem especially in pregnant women. The urine in the bladder can become filled with bacteria from multiple causes, including: °¨ Inflammation of the prostate gland (prostatitis). °¨ Sexual intercourse in females. °¨ Bladder infection (cystitis). °· Bacteria traveling from the bloodstream to the tissue part of the kidney. °Problems that may increase your risk of getting a kidney infection include: °· Diabetes. °· Kidney stones or bladder stones. °· Cancer. °· Catheters placed in the bladder. °· Other abnormalities of the kidney or ureter. °SYMPTOMS  °· Abdominal pain. °· Pain in the side or flank area. °· Fever. °· Chills. °· Upset stomach. °· Blood in the urine (dark urine). °· Frequent urination. °· Strong or persistent urge to urinate. °· Burning or stinging when urinating. °DIAGNOSIS  °Your caregiver may diagnose your kidney infection based on your symptoms. A urine sample may also be taken. °TREATMENT  °In general, treatment depends on how severe the infection is.  °· If the infection is mild and caught early, your caregiver may treat you with oral antibiotics and send you home. °· If the infection is more severe, the bacteria may have gotten into the bloodstream. This will require intravenous (IV) antibiotics and a hospital stay. Symptoms may include: °¨ High fever. °¨ Severe flank pain. °¨ Shaking chills. °· Even after a hospital stay, your caregiver may require you to be on oral antibiotics for a period of time. °· Other treatments may be required depending upon the cause of the infection. °HOME CARE INSTRUCTIONS  °· Take your  antibiotics as directed. Finish them even if you start to feel better. °· Make an appointment to have your urine checked to make sure the infection is gone. °· Drink enough fluids to keep your urine clear or pale yellow. °· Take medicines for the bladder if you have urgency and frequency of urination as directed by your caregiver. °SEEK IMMEDIATE MEDICAL CARE IF:  °· You have a fever or persistent symptoms for more than 2-3 days. °· You have a fever and your symptoms suddenly get worse. °· You are unable to take your antibiotics or fluids. °· You develop shaking chills. °· You experience extreme weakness or fainting. °· There is no improvement after 2 days of treatment. °MAKE SURE YOU: °· Understand these instructions. °· Will watch your condition. °· Will get help right away if you are not doing well or get worse. °Document Released: 10/17/2005 Document Revised: 04/17/2012 Document Reviewed: 03/23/2011 °[employer]® Patient Information ©2015 [employer]. This information is not intended to replace advice given to you by your health care provider. Make sure you discuss any questions you have with your health care provider. ° °

## 2015-03-29 NOTE — ED Notes (Signed)
Awaiting fluids to infuse prior to d/c.

## 2015-03-30 ENCOUNTER — Emergency Department: Payer: Medicare Other

## 2015-03-30 ENCOUNTER — Inpatient Hospital Stay
Admission: EM | Admit: 2015-03-30 | Discharge: 2015-04-03 | DRG: 690 | Disposition: A | Discharge status: Home / Self Care | Payer: Medicare Other | Attending: Internal Medicine | Admitting: Internal Medicine

## 2015-03-30 ENCOUNTER — Encounter: Payer: Self-pay | Admitting: *Deleted

## 2015-03-30 DIAGNOSIS — N183 Chronic kidney disease, stage 3 unspecified: Secondary | ICD-10-CM | POA: Diagnosis present

## 2015-03-30 DIAGNOSIS — Z882 Allergy status to sulfonamides status: Secondary | ICD-10-CM | POA: Diagnosis not present

## 2015-03-30 DIAGNOSIS — Z87442 Personal history of urinary calculi: Secondary | ICD-10-CM | POA: Diagnosis not present

## 2015-03-30 DIAGNOSIS — Z6827 Body mass index (BMI) 27.0-27.9, adult: Secondary | ICD-10-CM | POA: Diagnosis not present

## 2015-03-30 DIAGNOSIS — E039 Hypothyroidism, unspecified: Secondary | ICD-10-CM | POA: Diagnosis present

## 2015-03-30 DIAGNOSIS — Z79899 Other long term (current) drug therapy: Secondary | ICD-10-CM | POA: Diagnosis not present

## 2015-03-30 DIAGNOSIS — E871 Hypo-osmolality and hyponatremia: Secondary | ICD-10-CM

## 2015-03-30 DIAGNOSIS — N12 Tubulo-interstitial nephritis, not specified as acute or chronic: Secondary | ICD-10-CM | POA: Diagnosis present

## 2015-03-30 DIAGNOSIS — Z87891 Personal history of nicotine dependence: Secondary | ICD-10-CM | POA: Diagnosis not present

## 2015-03-30 DIAGNOSIS — Z66 Do not resuscitate: Secondary | ICD-10-CM | POA: Diagnosis present

## 2015-03-30 DIAGNOSIS — I10 Essential (primary) hypertension: Secondary | ICD-10-CM | POA: Diagnosis present

## 2015-03-30 DIAGNOSIS — R112 Nausea with vomiting, unspecified: Secondary | ICD-10-CM | POA: Diagnosis not present

## 2015-03-30 DIAGNOSIS — I129 Hypertensive chronic kidney disease with stage 1 through stage 4 chronic kidney disease, or unspecified chronic kidney disease: Secondary | ICD-10-CM | POA: Diagnosis present

## 2015-03-30 DIAGNOSIS — Z95828 Presence of other vascular implants and grafts: Secondary | ICD-10-CM

## 2015-03-30 DIAGNOSIS — Z7982 Long term (current) use of aspirin: Secondary | ICD-10-CM | POA: Diagnosis not present

## 2015-03-30 DIAGNOSIS — H9190 Unspecified hearing loss, unspecified ear: Secondary | ICD-10-CM | POA: Diagnosis present

## 2015-03-30 DIAGNOSIS — K219 Gastro-esophageal reflux disease without esophagitis: Secondary | ICD-10-CM | POA: Diagnosis present

## 2015-03-30 HISTORY — DX: Calculus of kidney: N20.0

## 2015-03-30 LAB — CBC WITH DIFFERENTIAL/PLATELET
BASOS ABS: 0 10*3/uL (ref 0–0.1)
Basophils Relative: 0 %
EOS PCT: 0 %
Eosinophils Absolute: 0 10*3/uL (ref 0–0.7)
HEMATOCRIT: 32.4 % — AB (ref 35.0–47.0)
Hemoglobin: 11 g/dL — ABNORMAL LOW (ref 12.0–16.0)
LYMPHS ABS: 0.5 10*3/uL — AB (ref 1.0–3.6)
Lymphocytes Relative: 7 %
MCH: 32.2 pg (ref 26.0–34.0)
MCHC: 34 g/dL (ref 32.0–36.0)
MCV: 94.7 fL (ref 80.0–100.0)
MONO ABS: 0.8 10*3/uL (ref 0.2–0.9)
MONOS PCT: 10 %
NEUTROS PCT: 83 %
Neutro Abs: 6.8 10*3/uL — ABNORMAL HIGH (ref 1.4–6.5)
PLATELETS: 224 10*3/uL (ref 150–440)
RBC: 3.42 MIL/uL — ABNORMAL LOW (ref 3.80–5.20)
RDW: 12 % (ref 11.5–14.5)
WBC: 8.1 10*3/uL (ref 3.6–11.0)

## 2015-03-30 LAB — URINALYSIS COMPLETE WITH MICROSCOPIC (ARMC ONLY)
BACTERIA UA: NONE SEEN
Bilirubin Urine: NEGATIVE
Glucose, UA: NEGATIVE mg/dL
Ketones, ur: NEGATIVE mg/dL
LEUKOCYTES UA: NEGATIVE
Nitrite: NEGATIVE
Protein, ur: 100 mg/dL — AB
SPECIFIC GRAVITY, URINE: 1.016 (ref 1.005–1.030)
pH: 6 (ref 5.0–8.0)

## 2015-03-30 LAB — COMPREHENSIVE METABOLIC PANEL
ALBUMIN: 3 g/dL — AB (ref 3.5–5.0)
ALT: 65 U/L — ABNORMAL HIGH (ref 14–54)
ANION GAP: 8 (ref 5–15)
AST: 42 U/L — ABNORMAL HIGH (ref 15–41)
Alkaline Phosphatase: 117 U/L (ref 38–126)
BILIRUBIN TOTAL: 0.6 mg/dL (ref 0.3–1.2)
BUN: 18 mg/dL (ref 6–20)
CO2: 24 mmol/L (ref 22–32)
Calcium: 8.5 mg/dL — ABNORMAL LOW (ref 8.9–10.3)
Chloride: 89 mmol/L — ABNORMAL LOW (ref 101–111)
Creatinine, Ser: 1.31 mg/dL — ABNORMAL HIGH (ref 0.44–1.00)
GFR calc Af Amer: 46 mL/min — ABNORMAL LOW (ref >60)
GFR, EST NON AFRICAN AMERICAN: 39 mL/min — AB (ref >60)
GLUCOSE: 143 mg/dL — AB (ref 65–99)
Potassium: 3.9 mmol/L (ref 3.5–5.1)
SODIUM: 121 mmol/L — AB (ref 135–145)
Total Protein: 6.9 g/dL (ref 6.5–8.1)

## 2015-03-30 MED ORDER — ONDANSETRON HCL 4 MG/2ML IJ SOLN
INTRAMUSCULAR | Status: AC
  Administered 2015-03-30: 4 mg via INTRAVENOUS
  Filled 2015-03-30: qty 2

## 2015-03-30 MED ORDER — CEFAZOLIN SODIUM 1-5 GM-% IV SOLN
INTRAVENOUS | Status: AC
  Filled 2015-03-30: qty 50

## 2015-03-30 MED ORDER — ONDANSETRON 4 MG PO TBDP
ORAL_TABLET | ORAL | Status: AC
  Administered 2015-03-30: 20:00:00 via ORAL
  Filled 2015-03-30: qty 1

## 2015-03-30 MED ORDER — ONDANSETRON 4 MG PO TBDP
4.0000 mg | ORAL_TABLET | Freq: Once | ORAL | Status: AC
  Administered 2015-03-30: 4 mg via ORAL

## 2015-03-30 MED ORDER — CEFTRIAXONE SODIUM IN DEXTROSE 20 MG/ML IV SOLN
1.0000 g | Freq: Once | INTRAVENOUS | Status: AC
  Administered 2015-03-30: 1 g via INTRAVENOUS

## 2015-03-30 MED ORDER — ONDANSETRON HCL 4 MG/2ML IJ SOLN
4.0000 mg | Freq: Once | INTRAMUSCULAR | Status: AC
  Administered 2015-03-30: 4 mg via INTRAVENOUS

## 2015-03-30 MED ORDER — SODIUM CHLORIDE 0.9 % IV SOLN
Freq: Once | INTRAVENOUS | Status: AC
  Administered 2015-03-30: 22:00:00 via INTRAVENOUS

## 2015-03-30 NOTE — ED Notes (Signed)
Pt reports she is being treated for a kidney infection, but she is "just not any better." States she has been taking meds as prescribed.  Pt says that she has been having some vomiting, chills/sweats, and a  headache. Pt states that she had a tick bite last week on her right shoulder.

## 2015-03-30 NOTE — H&P (Signed)
[name] NAME: Emma Walls    MR#:  [id number]  DATE OF BIRTH:  [birth year]  DATE OF ADMISSION:  03/30/2015  PRIMARY CARE PHYSICIAN: Idelle Crouch, MD   REQUESTING/REFERRING PHYSICIAN: [place]  CHIEF COMPLAINT:   Chief Complaint  Patient presents with  . Emesis    HISTORY OF PRESENT ILLNESS:  Emma Walls  is a 74 y.o. female who presents with persistent symptoms from pyelonephritis despite appropriate outpatient therapy. Patient states that she's been having continued chills at home, with intermittent fevers. She's also had continued nausea, back pain, malaise, and urinary symptoms. She was initially diagnosed and given ciprofloxacin for her UTI with suspected pyelonephritis, after minimal to no improvement on this she came to the ED couple of days ago and had her medication changed to Bactrim. She presents again today with only very mild improvement of her symptoms. Of note she does state that she had a tick bite about one week ago. 8 days ago she was walking in the woods with her husband, and 6 days ago her husband pulled and engorged take off of her shoulder. She denies any rash or joint pains or other symptoms that seem unrelated to her persistent pyelonephritis, which was confirmed by CT scan in the ED today. Hospitalists were called for admission for IV antibiotics for pyelonephritis that has failed outpatient therapy.  PAST MEDICAL HISTORY:   Past Medical History  Diagnosis Date  . Hypertension   . H/O seasonal allergies   . History of migraine   . Mild memory disturbance OCCASIONAL FORGETFULNESS  . Hypothyroidism   . GERD (gastroesophageal reflux disease)   . Constipation   . Microhematuria   . Impaired hearing BILATERAL HEARING AIDS  . Kidney stones     PAST SURGICAL HISTORY:   Past Surgical History  Procedure Laterality Date  . Vaginal hysterectomy  AGE 37  . Benign breast bx  2007  (APPROX)  . Cystoscopy  w/ retrogrades  05/16/2012    Procedure: CYSTOSCOPY WITH RETROGRADE PYELOGRAM;  Surgeon: Molli Hazard, MD;  Location: [facility];  Service: Urology;  Laterality: Bilateral;  45 MINUTES    . 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 Kidney stone surgery      SOCIAL HISTORY:   History  Substance Use Topics  . Smoking status: Former Research scientist (life sciences)  . Smokeless tobacco: Never Used  . Alcohol Use: Yes     Comment: SOCIAL    FAMILY HISTORY:   Family History  Problem Relation Age of Onset  . CAD Father   . Colon cancer Sister     DRUG ALLERGIES:   Allergies  Allergen Reactions  . Codeine Other (See Comments)    Reaction:  Hallucinations   . Statins Other (See Comments)    Reaction:  Muscle aches   . Sulfa Antibiotics Other (See Comments)    Reaction:  Unknown     MEDICATIONS AT HOME:   Prior to Admission medications   Medication Sig Start Date End Date Taking? Authorizing Provider  aspirin EC 81 MG tablet Take 81 mg by mouth daily.   Yes Historical Provider, MD  Calcium-Magnesium-Vitamin D (CALCIUM 500 PO) Take 1 tablet by mouth daily.   Yes Historical Provider, MD  cefUROXime (CEFTIN) 500 MG tablet Take 1 tablet (500 mg total) by mouth 2 (two) times daily. 03/29/15 04/08/15 Yes Orbie Pyo, MD  cetirizine (ZYRTEC) 10 MG tablet Take 10 mg by mouth daily  as needed for allergies.   Yes Historical Provider, MD  donepezil (ARICEPT) 5 MG tablet Take 5 mg by mouth at bedtime.   Yes Historical Provider, MD  fluticasone (FLONASE) 50 MCG/ACT nasal spray Place 2 sprays into both nostrils daily as needed for rhinitis.   Yes Historical Provider, MD  levothyroxine (SYNTHROID, LEVOTHROID) 88 MCG tablet Take 88 mcg by mouth daily.    Yes Historical Provider, MD  losartan (COZAAR) 50 MG tablet Take 50 mg by mouth daily.   Yes Historical Provider, MD  metoprolol succinate (TOPROL-XL) 50 MG 24 hr tablet Take 50 mg by mouth daily.   Yes Historical Provider, MD  Multiple  Vitamins-Minerals (MULTIVITAMIN PO) Take 15 mLs by mouth daily.   Yes Historical Provider, MD  hyoscyamine (LEVSIN, ANASPAZ) 0.125 MG tablet Take 1 tablet (0.125 mg total) by mouth every 4 (four) hours as needed for cramping (bladder spasms). Patient not taking: Reported on 03/30/2015 05/16/12 05/26/12  Rolan Bucco, MD    REVIEW OF SYSTEMS:  Review of Systems  Constitutional: Positive for fever, chills and malaise/fatigue. Negative for weight loss.  HENT: Positive for hearing loss (chronic). Negative for ear pain and tinnitus.   Eyes: Negative for blurred vision, double vision, pain and redness.  Respiratory: Negative for cough, hemoptysis and shortness of breath.   Cardiovascular: Negative for chest pain, palpitations, orthopnea and leg swelling.  Gastrointestinal: Positive for nausea and vomiting. Negative for abdominal pain, diarrhea and constipation.  Genitourinary: Negative for dysuria, frequency and hematuria.  Musculoskeletal: Positive for back pain. Negative for joint pain and neck pain.  Skin:       No acne, rash, or lesions  Neurological: Negative for dizziness, tremors, focal weakness and weakness.  Endo/Heme/Allergies: Negative for polydipsia. Does not bruise/bleed easily.  Psychiatric/Behavioral: Negative for depression. The patient is not nervous/anxious and does not have insomnia.      VITAL SIGNS:   Filed Vitals:   03/30/15 1920 03/30/15 2154 03/30/15 2324  BP: 137/89 150/84 113/58  Pulse: 85 89   Temp: 97.6 F (36.4 C)    TempSrc: Oral    Resp: 22 16   Height: 5\' 1"  (1.549 m)    Weight: 62.596 kg (138 lb)    SpO2: 100% 95% 94%   Wt Readings from Last 3 Encounters:  03/30/15 62.596 kg (138 lb)  03/28/15 62.596 kg (138 lb)  05/10/12 62.596 kg (138 lb)    PHYSICAL EXAMINATION:  Physical Exam  Constitutional: She is oriented to person, place, and time. She appears well-developed and well-nourished. No distress.  HENT:  Head: Normocephalic and atraumatic.   Mouth/Throat: Oropharynx is clear and moist.  Eyes: Conjunctivae and EOM are normal. Pupils are equal, round, and reactive to light. No scleral icterus.  Neck: Normal range of motion. Neck supple. No JVD present. No thyromegaly present.  Cardiovascular: Normal rate, regular rhythm and intact distal pulses.  Exam reveals no gallop and no friction rub.   No murmur heard. Respiratory: Effort normal and breath sounds normal. No respiratory distress. She has no wheezes. She has no rales.  GI: Soft. Bowel sounds are normal. She exhibits no distension. There is tenderness (mild lower abdominal).  Musculoskeletal: Normal range of motion. She exhibits tenderness (bilateral CVA). She exhibits no edema.  No arthritis, no gout  Lymphadenopathy:    She has no cervical adenopathy.  Neurological: She is alert and oriented to person, place, and time. No cranial nerve deficit.  No dysarthria, no aphasia  Skin: Skin is warm and  dry. No rash noted. No erythema.  Psychiatric: She has a normal mood and affect. Her behavior is normal. Judgment and thought content normal.    LABORATORY PANEL:   CBC  Recent Labs Lab 03/30/15 1932  WBC 8.1  HGB 11.0*  HCT 32.4*  PLT 224   ------------------------------------------------------------------------------------------------------------------  Chemistries   Recent Labs Lab 03/30/15 1932  NA 121*  K 3.9  CL 89*  CO2 24  GLUCOSE 143*  BUN 18  CREATININE 1.31*  CALCIUM 8.5*  AST 42*  ALT 65*  ALKPHOS 117  BILITOT 0.6   ------------------------------------------------------------------------------------------------------------------  Cardiac Enzymes No results for input(s): TROPONINI in the last 168 hours. ------------------------------------------------------------------------------------------------------------------  RADIOLOGY:  Ct Abdomen Pelvis Wo Contrast  03/30/2015   CLINICAL DATA:  Patient being treated for kidney infection since last  Tuesday with worsening bilateral flank pain. Vomiting with chills/sweats and headache.  EXAM: CT ABDOMEN AND PELVIS WITHOUT CONTRAST  TECHNIQUE: Multidetector CT imaging of the abdomen and pelvis was performed following the standard protocol without IV contrast.  COMPARISON:  04/24/2012 and 03/11/2012  FINDINGS: Lung bases are within normal.  Abdominal images demonstrate a normal liver, spleen, gallbladder and adrenal glands. Pancreas demonstrates very subtle ill definition of the fat planes near the head of the pancreas and posterior to the distal body of the pancreas likely related to patient's presumed kidney infection. However, cannot completely exclude early acute pancreatitis. Appendix is normal. There is minimal calcified plaque over the abdominal aorta.  Kidneys are normal in size without hydronephrosis or nephrolithiasis. There is subtle stranding of the perinephric fat bilaterally. Ureters are unremarkable.  Pelvic images demonstrate the bladder and rectosigmoid colon to be within normal ovaries are normal. There is a small amount of free fluid present. Surgical absence of the uterus. There is mild degenerate changes spine with moderate disc space narrowing at the L4-5 level unchanged.  IMPRESSION: Subtle bilateral stranding of the perinephric fat which may be related to patient's reported pyelonephritis. Sub cm hypodensity over the mid pole cortex of the left kidney which may represent a cyst, although is too small to characterize. Alternatively this may represent part of patient's infection.  Subtle ill definition of the peripancreatic fat planes likely secondary to the presumed adjacent kidney infection, although cannot completely exclude mild acute pancreatitis.   Electronically Signed   By: Marin Olp M.D.   On: 03/30/2015 21:25    EKG:   Orders placed or performed during the hospital encounter of 05/16/12  . EKG 12-Lead  . EKG 12-Lead    IMPRESSION AND PLAN:  Principal Problem:    Pyelonephritis - white blood cell count from her visit a couple days ago was elevated at 13, and has normalized today. Urine culture drawn from that prior ED visit has grown greater than 100,000 colonies of Escherichia coli, susceptibilities still pending. She was given IV Rocephin in the ED, we'll continue this antibiotic for now and follow up culture results. UA today seemed improved from prior. Active Problems:   HTN (hypertension) - chronic stable problem, continue home medications.   GERD without esophagitis - PPI while inpatient.   CKD (chronic kidney disease) stage 3, GFR 30-59 ml/min - seems chronic and stable, creatinine is actually improved from her prior ED visit. Monitor, avoid renal toxins.   Hypothyroidism - continue home dose of thyroid hormone replacement.   All the records are reviewed and case discussed with ED provider. Management plans discussed with the patient and/or family.  DVT PROPHYLAXIS: SubQ heparin  ADMISSION STATUS: Inpatient  CODE STATUS: Full Advance Directive Documentation        Most Recent Value   Type of Advance Directive  Healthcare Power of Attorney   Pre-existing out of facility DNR order (yellow form or pink MOST form)     "MOST" Form in Place?        TOTAL TIME TAKING CARE OF THIS PATIENT: 50 minutes.    Lyndsay Talamante Franklin 03/30/2015, 11:57 PM  [facility]  Office  [phone]  CC: Primary care physician; Idelle Crouch, MD

## 2015-03-30 NOTE — ED Notes (Signed)
Patient is resting comfortably. 

## 2015-03-30 NOTE — ED Notes (Signed)
Denies SOB or chest pain.

## 2015-03-30 NOTE — ED Notes (Signed)
Family at bedside. 

## 2015-03-30 NOTE — ED Provider Notes (Signed)
[facility] Emergency Department Provider Note     Time seen: ----------------------------------------- 8:58 PM on 03/30/2015 -----------------------------------------    I have reviewed the triage vital signs and the nursing notes.   HISTORY  Chief Complaint Emesis    HPI Emma Walls is a 74 y.o. female since ER for feeling poorly. Patient states she was recently treated for pyelonephritis, she has not gotten any better. Patient states been taking antibiotics as prescribed. His take about 4 days of antibiotics, has some chills and sweats today, no fever. Has had a significant headache and had some vomiting. States she had a also a tick bite last week. Patient claims of diffuse weakness, no focal pain. Has had severe dull abdominal pain that has resolved. Patient has had a history of kidney stones, currently is not in any pain.     Past Medical History  Diagnosis Date  . Hypertension   . H/O seasonal allergies   . History of migraine   . Mild memory disturbance OCCASIONAL FORGETFULNESS  . Hypothyroidism   . GERD (gastroesophageal reflux disease)   . Constipation   . Microhematuria   . Impaired hearing BILATERAL HEARING AIDS  . Kidney stones     There are no active problems to display for this patient.   Past Surgical History  Procedure Laterality Date  . Vaginal hysterectomy  AGE 26  . Benign breast bx  2007  (APPROX)  . Cystoscopy w/ retrogrades  05/16/2012    Procedure: CYSTOSCOPY WITH RETROGRADE PYELOGRAM;  Surgeon: Molli Hazard, MD;  Location: [facility];  Service: Urology;  Laterality: Bilateral;  45 MINUTES    . 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 Kidney stone surgery      Current Outpatient Rx  Name  Route  Sig  Dispense  Refill  . aspirin 81 MG tablet   Oral   Take 81 mg by mouth daily.         . cefUROXime (CEFTIN) 500 MG tablet   Oral   Take 1 tablet (500 mg total) by mouth 2 (two) times daily.  20 tablet   0   . cetirizine (ZYRTEC) 10 MG tablet   Oral   Take 10 mg by mouth as needed.         . COCONUT OIL PO   Oral   Take by mouth daily.         Marland Kitchen docusate sodium (COLACE) 100 MG capsule   Oral   Take 200 mg by mouth daily.         Marland Kitchen donepezil (ARICEPT) 5 MG tablet   Oral   Take 5 mg by mouth at bedtime.         Marland Kitchen EXPIRED: hyoscyamine (LEVSIN, ANASPAZ) 0.125 MG tablet   Oral   Take 1 tablet (0.125 mg total) by mouth every 4 (four) hours as needed for cramping (bladder spasms).   40 tablet   4   . levothyroxine (SYNTHROID, LEVOTHROID) 88 MCG tablet   Oral   Take 88 mcg by mouth every evening.         Marland Kitchen losartan (COZAAR) 50 MG tablet   Oral   Take 50 mg by mouth daily.         Marland Kitchen OVER THE COUNTER MEDICATION   Oral   Take by mouth daily. VEMMA LIQUID VITAMIN/ MINERAL SUPP.         . ranitidine (ZANTAC) 300 MG capsule   Oral   Take  300 mg by mouth 2 (two) times daily.         . Sucralfate (CARAFATE PO)   Oral   Take by mouth as needed.           Allergies Codeine; Statins; and Sulfa antibiotics  No family history on file.  Social History History  Substance Use Topics  . Smoking status: Former Research scientist (life sciences)  . Smokeless tobacco: Never Used  . Alcohol Use: Yes     Comment: SOCIAL    Review of Systems Constitutional: Negative for fever. Eyes: Negative for visual changes. ENT: Negative for sore throat. Cardiovascular: Negative for chest pain. Respiratory: Negative for shortness of breath. Gastrointestinal: Negative for abdominal pain, positive for vomiting Genitourinary: Negative for dysuria. Musculoskeletal: Negative for back pain. Skin: Negative for rash. Neurological:  positive for weakness and headache  10-point ROS otherwise negative.  ____________________________________________   PHYSICAL EXAM:  VITAL SIGNS: ED Triage Vitals  Enc Vitals Group     BP 03/30/15 1920 137/89 mmHg     Pulse Rate 03/30/15 1920 85     Resp  03/30/15 1920 22     Temp 03/30/15 1920 97.6 F (36.4 C)     Temp Source 03/30/15 1920 Oral     SpO2 03/30/15 1920 100 %     Weight 03/30/15 1920 138 lb (62.596 kg)     Height 03/30/15 1920 5\' 1"  (1.549 m)     Head Cir --      Peak Flow --      Pain Score 03/30/15 1924 1     Pain Loc --      Pain Edu? --      Excl. in [place]? --     Constitutional: Alert aMild distresses: Conjunctivae are normal. PERRL. Normal extraocular movements. ENT   Head: Normocephalic and atraumatic.   Nose: No congestion/rhinnorhea.   Mouth/Throat: Mucous membranes are moist.   Neck: No stridor. Hematological/Lymphatic/Immunilogical: No cervical lymphadenopathy. Cardiovascular: Normal rate, regular rhythm. Normal and symmetric distal pulses are present in all extremities. No murmurs, rubs, or gallops. Respiratory: Normal respiratory effort without tachypnea nor retractions. Breath sounds are clear and equal bilaterally. No wheezes/rales/rhonchi. Gastrointestinal: Soft and nontender. No distention. No abdominal bruits. There is no CVA tenderness. Musculoskeletal: Nontender with normal range of motion in all extremities. No joint effusions.  No lower extremity tenderness nor edema. Neurologic:  Normal speech and language. No gross focal neurologic deficits are appreciated. Speech is normal. No gait instability. Skin:  Skin is warm, dry and intact. No rash noted. Psychiatric: Mood and affect are normal. Speech and behavior are normal. Patient exhibits appropriate insight and judgment.  ____________________________________________    LABS (pertinent positives/negatives)  Labs Reviewed  CBC WITH DIFFERENTIAL/PLATELET - Abnormal; Notable for the following:    RBC 3.42 (*)    Hemoglobin 11.0 (*)    HCT 32.4 (*)    Neutro Abs 6.8 (*)    Lymphs Abs 0.5 (*)    All other components within normal limits  COMPREHENSIVE METABOLIC PANEL - Abnormal; Notable for the following:    Sodium 121 (*)    Chloride  89 (*)    Glucose, Bld 143 (*)    Creatinine, Ser 1.31 (*)    Calcium 8.5 (*)    Albumin 3.0 (*)    AST 42 (*)    ALT 65 (*)    GFR calc non Af Amer 39 (*)    GFR calc Af Amer 46 (*)    All other components within  normal limits  URINALYSIS COMPLETEWITH MICROSCOPIC ([facility] ONLY) - Abnormal; Notable for the following:    Color, Urine YELLOW (*)    APPearance CLEAR (*)    Hgb urine dipstick 2+ (*)    Protein, ur 100 (*)    Squamous Epithelial / LPF 0-5 (*)    All other components within normal limits  ____________________________________________  ED COURSE:  Pertinent labs & imaging results that were available during my care of the patient were reviewed by me and considered in my medical decision making (see chart for details).   patient given IV normal saline to treat hyponatremia as well as IV Rocephin and continue treating her urinary tract infection. Urine has improved but is not completely resolved. Patient also had CT scan to return rule out renal stone   Radiology:   CT abdomen and pelvis   IMPRESSION: Subtle bilateral stranding of the perinephric fat which may be related to patient's reported pyelonephritis. Sub cm hypodensity over the mid pole cortex of the left kidney which may represent a cyst, although is too small to characterize. Alternatively this may represent part of patient's infection.  Subtle ill definition of the peripancreatic fat planes likely secondary to the presumed adjacent kidney infection, although cannot completely exclude mild acute pancreatitis.  FINAL ASSESSMENT AND PLAN  Hyponatremia, weakness, complicated cystitis  Plan: Patient has received treatment as listed above. Will need admission to the hospital for failing outpatient treatment 2 for UTI. Patient is also significantly hyponatremic with sodium 121.     Earleen Newport, MD   Earleen Newport, MD 03/30/15 [phone]

## 2015-03-31 LAB — BASIC METABOLIC PANEL
Anion gap: 7 (ref 5–15)
BUN: 17 mg/dL (ref 6–20)
CALCIUM: 8.6 mg/dL — AB (ref 8.9–10.3)
CO2: 26 mmol/L (ref 22–32)
Chloride: 97 mmol/L — ABNORMAL LOW (ref 101–111)
Creatinine, Ser: 1.27 mg/dL — ABNORMAL HIGH (ref 0.44–1.00)
GFR calc non Af Amer: 41 mL/min — ABNORMAL LOW (ref >60)
GFR, EST AFRICAN AMERICAN: 47 mL/min — AB (ref >60)
GLUCOSE: 142 mg/dL — AB (ref 65–99)
POTASSIUM: 4 mmol/L (ref 3.5–5.1)
Sodium: 130 mmol/L — ABNORMAL LOW (ref 135–145)

## 2015-03-31 LAB — CBC
HEMATOCRIT: 30.6 % — AB (ref 35.0–47.0)
Hemoglobin: 10.7 g/dL — ABNORMAL LOW (ref 12.0–16.0)
MCH: 33.4 pg (ref 26.0–34.0)
MCHC: 35.1 g/dL (ref 32.0–36.0)
MCV: 95.1 fL (ref 80.0–100.0)
PLATELETS: 215 10*3/uL (ref 150–440)
RBC: 3.21 MIL/uL — ABNORMAL LOW (ref 3.80–5.20)
RDW: 11.9 % (ref 11.5–14.5)
WBC: 7.8 10*3/uL (ref 3.6–11.0)

## 2015-03-31 LAB — URINE CULTURE

## 2015-03-31 LAB — LIPASE, BLOOD: LIPASE: 50 U/L (ref 22–51)

## 2015-03-31 MED ORDER — CEFTRIAXONE SODIUM IN DEXTROSE 20 MG/ML IV SOLN
1.0000 g | INTRAVENOUS | Status: DC
  Filled 2015-03-31: qty 50

## 2015-03-31 MED ORDER — SODIUM CHLORIDE 0.9 % IV SOLN
1.0000 g | INTRAVENOUS | Status: DC
  Administered 2015-03-31 – 2015-04-03 (×4): 1 g via INTRAVENOUS
  Filled 2015-03-31 (×5): qty 1

## 2015-03-31 MED ORDER — DOXYCYCLINE HYCLATE 100 MG PO TABS
100.0000 mg | ORAL_TABLET | Freq: Two times a day (BID) | ORAL | Status: DC
  Administered 2015-03-31 – 2015-04-03 (×7): 100 mg via ORAL
  Filled 2015-03-31 (×7): qty 1

## 2015-03-31 MED ORDER — LOSARTAN POTASSIUM 50 MG PO TABS
50.0000 mg | ORAL_TABLET | Freq: Every day | ORAL | Status: DC
  Administered 2015-03-31: 09:00:00 50 mg via ORAL
  Filled 2015-03-31: qty 1

## 2015-03-31 MED ORDER — LEVOTHYROXINE SODIUM 88 MCG PO TABS
88.0000 ug | ORAL_TABLET | Freq: Every day | ORAL | Status: DC
  Administered 2015-03-31 – 2015-04-03 (×5): 88 ug via ORAL
  Filled 2015-03-31 (×6): qty 1

## 2015-03-31 MED ORDER — SODIUM CHLORIDE 0.9 % IV SOLN
INTRAVENOUS | Status: AC
  Administered 2015-03-31: 02:00:00 via INTRAVENOUS

## 2015-03-31 MED ORDER — HEPARIN SODIUM (PORCINE) 5000 UNIT/ML IJ SOLN
5000.0000 [IU] | Freq: Three times a day (TID) | INTRAMUSCULAR | Status: DC
  Administered 2015-03-31 – 2015-04-01 (×5): 5000 [IU] via SUBCUTANEOUS
  Filled 2015-03-31 (×8): qty 1

## 2015-03-31 MED ORDER — ASPIRIN EC 81 MG PO TBEC
81.0000 mg | DELAYED_RELEASE_TABLET | Freq: Every day | ORAL | Status: DC
  Administered 2015-03-31 – 2015-04-03 (×4): 81 mg via ORAL
  Filled 2015-03-31 (×4): qty 1

## 2015-03-31 MED ORDER — DONEPEZIL HCL 5 MG PO TABS
5.0000 mg | ORAL_TABLET | Freq: Every day | ORAL | Status: DC
  Administered 2015-03-31 – 2015-04-02 (×4): 5 mg via ORAL
  Filled 2015-03-31 (×4): qty 1

## 2015-03-31 MED ORDER — ACETAMINOPHEN 325 MG PO TABS
650.0000 mg | ORAL_TABLET | Freq: Four times a day (QID) | ORAL | Status: DC | PRN
  Administered 2015-03-31 – 2015-04-03 (×7): 650 mg via ORAL
  Filled 2015-03-31 (×7): qty 2

## 2015-03-31 MED ORDER — SENNOSIDES-DOCUSATE SODIUM 8.6-50 MG PO TABS: 1.0000 | ORAL_TABLET | Freq: Every evening | ORAL | Status: DC | PRN

## 2015-03-31 MED ORDER — METOPROLOL SUCCINATE ER 25 MG PO TB24
50.0000 mg | ORAL_TABLET | Freq: Every day | ORAL | Status: DC
  Administered 2015-03-31 – 2015-04-03 (×4): 50 mg via ORAL
  Filled 2015-03-31 (×4): qty 2

## 2015-03-31 MED ORDER — ONDANSETRON HCL 4 MG/2ML IJ SOLN
4.0000 mg | Freq: Four times a day (QID) | INTRAMUSCULAR | Status: DC | PRN
  Administered 2015-03-31: 22:00:00 4 mg via INTRAVENOUS
  Filled 2015-03-31 (×2): qty 2

## 2015-03-31 MED ORDER — SERTRALINE HCL 50 MG PO TABS
50.0000 mg | ORAL_TABLET | Freq: Every day | ORAL | Status: DC
  Administered 2015-03-31 – 2015-04-02 (×3): 50 mg via ORAL
  Filled 2015-03-31 (×4): qty 1

## 2015-03-31 MED ORDER — PANTOPRAZOLE SODIUM 40 MG PO TBEC
40.0000 mg | DELAYED_RELEASE_TABLET | Freq: Every day | ORAL | Status: DC
  Administered 2015-03-31 – 2015-04-02 (×3): 40 mg via ORAL
  Filled 2015-03-31 (×4): qty 1

## 2015-03-31 MED ORDER — ACETAMINOPHEN 650 MG RE SUPP: 650.0000 mg | Freq: Four times a day (QID) | RECTAL | Status: DC | PRN

## 2015-03-31 MED ORDER — ONDANSETRON HCL 4 MG PO TABS
4.0000 mg | ORAL_TABLET | Freq: Four times a day (QID) | ORAL | Status: DC | PRN
  Administered 2015-04-01: 12:00:00 4 mg via ORAL
  Filled 2015-03-31: qty 1

## 2015-03-31 NOTE — Plan of Care (Signed)
Problem: Discharge Progression Outcomes Goal: Discharge plan in place and appropriate Individualization Outcome: Progressing Patient goes by Emma Walls, lives at home with husband. 8 days ago reports that she had a tick bite her rt shoulder, small scab is visible.  Hx of hypertension,seasonal allergies, migraines, Mild memory disturb ance  with occasional  forgetfulness, Hypothyroidism, GERD, Constipation, Microhematuria, Impaired hearing, and Kidney stones. Pt is on home medications    Goal: Other Discharge Outcomes/Goals Outcome: Progressing Plan of care progress to goal:  C/o back pain 1x, tylenol given PRN, pt resting quietly after. Walking with standby assist. Pt feels very weak. No c/o nausea or vomiting.   Pt now on contact precautions fr ESBL in urine, IV invanz ordered, IVF discontinued.

## 2015-03-31 NOTE — Plan of Care (Signed)
Problem: Discharge Progression Outcomes Goal: Discharge plan in place and appropriate Individualization Outcome: Progressing Patient goes by Emma Walls, lives at home with husband. 8 days ago reports that she had a tick bite her rt shoulder, small scab is visible. Hx of hypertension,seasonal allergies, migraines, Mild memory disturb ance  with occasional  forgetfulness, Hypothyroidism, GERD, Constipation, Microhematuria, Impaired hearing, and Kidney stones. Pt is on home medications             Goal: Other Discharge Outcomes/Goals Outcome: Progressing Pt alert and oriented, she is hoh and did not bring her hearing aids with her. She does not complain of pain or nausea at this time, she ate about 50% of her meal and is drinking fluids. Will continue to monitor.

## 2015-03-31 NOTE — Progress Notes (Signed)
Malonie Degree is a 74 y.o. female  Pyelonephritis   SUBJECTIVE:  Pt admitted with malaise and pyelonephritis. Seems depressed but in NAD. Tolerating diet without N/V. Afebrile.  ______________________________________________________________________  ROS: Review of systems is unremarkable for any active cardiac,respiratory, GI, GU, hematologic, neurologic or psychiatric systems, 10 systems reviewed.  @CMEDLIST @  Past Medical History  Diagnosis Date  . Hypertension   . H/O seasonal allergies   . History of migraine   . Mild memory disturbance OCCASIONAL FORGETFULNESS  . Hypothyroidism   . GERD (gastroesophageal reflux disease)   . Constipation   . Microhematuria   . Impaired hearing BILATERAL HEARING AIDS  . Kidney stones     Past Surgical History  Procedure Laterality Date  . Vaginal hysterectomy  AGE 15  . Benign breast bx  2007  (APPROX)  . Cystoscopy w/ retrogrades  05/16/2012    Procedure: CYSTOSCOPY WITH RETROGRADE PYELOGRAM;  Surgeon: Molli Hazard, MD;  Location: [facility];  Service: Urology;  Laterality: Bilateral;  45 MINUTES    . 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 Kidney stone surgery      PHYSICAL EXAM:  BP 134/77 mmHg  Pulse 84  Temp(Src) 98.9 F (37.2 C) (Oral)  Resp 18  Ht 5\' 1"  (1.549 m)  Wt 65.273 kg (143 lb 14.4 oz)  BMI 27.20 kg/m2  SpO2 100%  Wt Readings from Last 3 Encounters:  03/31/15 65.273 kg (143 lb 14.4 oz)  03/28/15 62.596 kg (138 lb)  05/10/12 62.596 kg (138 lb)            Constitutional: NAD Neck: supple, no thyromegaly Respiratory: CTA, no rales or wheezes Cardiovascular: RRR, no murmur, no gallop Abdomen: soft, good BS, nontender Extremities: no edema Neuro: alert and oriented, no focal motor or sensory deficits  ASSESSMENT/PLAN:  Labs and imaging studies were reviewed  Will continue IV ABX and IV fluids. Add po Doxycyline due to pt's fears of RMSF and recent tick exposure. Begin Zoloft. F/u labs in  AM.

## 2015-03-31 NOTE — Care Management (Signed)
Admitted to [facility] with the diagnosis of pyelonephritis. Lives with husband, Jeneen Rinks [phone]). Sees Dr. Doy Hutching. Last seen 2 weeks ago. No home health. No skilled facility. No home oxygen. Uses no aides for ambulation. Family will transport. Shelbie Ammons RN MSN Care Management [phone]

## 2015-04-01 ENCOUNTER — Inpatient Hospital Stay: Payer: Medicare Other

## 2015-04-01 LAB — CBC WITH DIFFERENTIAL/PLATELET
BASOS ABS: 0 10*3/uL (ref 0.0–0.1)
BLASTS: 0 %
Band Neutrophils: 3 % (ref 0–10)
Basophils Relative: 0 % (ref 0–1)
EOS ABS: 0.2 10*3/uL (ref 0.0–0.7)
Eosinophils Relative: 2 % (ref 0–5)
HCT: 30.6 % — ABNORMAL LOW (ref 35.0–47.0)
HEMOGLOBIN: 10.7 g/dL — AB (ref 12.0–16.0)
LYMPHS ABS: 1.1 10*3/uL (ref 0.7–4.0)
Lymphocytes Relative: 14 % (ref 12–46)
MCH: 32.9 pg (ref 26.0–34.0)
MCHC: 35.1 g/dL (ref 32.0–36.0)
MCV: 94 fL (ref 80.0–100.0)
MONO ABS: 0.9 10*3/uL (ref 0.1–1.0)
Metamyelocytes Relative: 1 %
Monocytes Relative: 11 % (ref 3–12)
Myelocytes: 0 %
NEUTROS ABS: 5.6 10*3/uL (ref 1.7–7.7)
Neutrophils Relative %: 69 % (ref 43–77)
PLATELETS: 226 10*3/uL (ref 150–440)
Promyelocytes Absolute: 0 %
RBC: 3.26 MIL/uL — ABNORMAL LOW (ref 3.80–5.20)
RDW: 12 % (ref 11.5–14.5)
WBC: 7.8 10*3/uL (ref 3.6–11.0)
nRBC: 0 /100 WBC

## 2015-04-01 LAB — BASIC METABOLIC PANEL
ANION GAP: 6 (ref 5–15)
BUN: 15 mg/dL (ref 6–20)
CALCIUM: 8.8 mg/dL — AB (ref 8.9–10.3)
CHLORIDE: 101 mmol/L (ref 101–111)
CO2: 26 mmol/L (ref 22–32)
Creatinine, Ser: 1.33 mg/dL — ABNORMAL HIGH (ref 0.44–1.00)
GFR calc Af Amer: 45 mL/min — ABNORMAL LOW (ref >60)
GFR calc non Af Amer: 39 mL/min — ABNORMAL LOW (ref >60)
GLUCOSE: 112 mg/dL — AB (ref 65–99)
POTASSIUM: 3.7 mmol/L (ref 3.5–5.1)
Sodium: 133 mmol/L — ABNORMAL LOW (ref 135–145)

## 2015-04-01 MED ORDER — LOSARTAN POTASSIUM 50 MG PO TABS
50.0000 mg | ORAL_TABLET | Freq: Two times a day (BID) | ORAL | Status: DC
  Administered 2015-04-01 – 2015-04-03 (×5): 50 mg via ORAL
  Filled 2015-04-01 (×5): qty 1

## 2015-04-01 NOTE — Plan of Care (Signed)
Problem: Discharge Progression Outcomes Goal: Other Discharge Outcomes/Goals Outcome: Progressing Continues with back back. Medicated with Tylenol offering relief. Had PICC placement today and tolerated well. Plans for discharge tomorrow with PICC and continuing antibiotics at home. Up walking to bathroom and in chair tolerated well.

## 2015-04-01 NOTE — Plan of Care (Signed)
Problem: Discharge Progression Outcomes Goal: Other Discharge Outcomes/Goals Plan of care progress to goals: 1. C/o back pain x1 relieved by PRN Tylenol 2. Hemodynamically: vitals stable, afebrile. IV Abx given as scheduled. 3. Complications: nausea once relieved by PRN Zofran. Pt on contact precautions for ESBL in urine. 4. Tolerating diet: pt did not eat dinner last night due to nausea 5. Activity: Walks with standby assist to bathroom due to generalized weakness No fall/inury this shift. Will continue to assess.

## 2015-04-01 NOTE — Care Management (Signed)
Spoke with Dr. Doy Hutching. Possible discharge to home tomorrow. Will need Invanz 1 gram IV daily x 8 more days.  Will discuss agency selection with Ms. Karcher.  Shelbie Ammons RN MSN Care Management [phone]

## 2015-04-01 NOTE — Plan of Care (Signed)
Problem: Discharge Progression Outcomes Goal: Discharge plan in place and appropriate Individualization Patient goes by Emma Walls, lives at home with husband.  8 days ago reports that she had a tick bite her rt shoulder, small scab is visible. Hx of hypertension,seasonal allergies, migraines, Mild memory disturb ance  with occasional  forgetfulness, Hypothyroidism, GERD, Constipation, Microhematuria, Impaired hearing, and Kidney stones controlled by home medications

## 2015-04-01 NOTE — Progress Notes (Signed)
Emma Walls is a 74 y.o. female  Pyelonephritis   SUBJECTIVE:  ESBL in the urine. Now on Invanz. Feeling slightly better this AM. Afebrile. No vomiting or diarrhea.  ______________________________________________________________________  ROS: Review of systems is unremarkable for any active cardiac,respiratory, GI, GU, hematologic, neurologic or psychiatric systems, 10 systems reviewed.  @CMEDLIST @  Past Medical History  Diagnosis Date  . Hypertension   . H/O seasonal allergies   . History of migraine   . Mild memory disturbance OCCASIONAL FORGETFULNESS  . Hypothyroidism   . GERD (gastroesophageal reflux disease)   . Constipation   . Microhematuria   . Impaired hearing BILATERAL HEARING AIDS  . Kidney stones     Past Surgical History  Procedure Laterality Date  . Vaginal hysterectomy  AGE 42  . Benign breast bx  2007  (APPROX)  . Cystoscopy w/ retrogrades  05/16/2012    Procedure: CYSTOSCOPY WITH RETROGRADE PYELOGRAM;  Surgeon: Molli Hazard, MD;  [facility];  Service: Urology;  Laterality: Bilateral;  45 MINUTES    . 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 Kidney stone surgery      PHYSICAL EXAM:  BP 160/75 mmHg  Pulse 75  Temp(Src) 98.5 F (36.9 C) (Oral)  Resp 18  Ht 5\' 1"  (1.549 m)  Wt 65.273 kg (143 lb 14.4 oz)  BMI 27.20 kg/m2  SpO2 95%  Wt Readings from Last 3 Encounters:  03/31/15 65.273 kg (143 lb 14.4 oz)  03/28/15 62.596 kg (138 lb)  05/10/12 62.596 kg (138 lb)            Constitutional: NAD Neck: supple, no thyromegaly Respiratory: CTA, no rales or wheezes Cardiovascular: RRR, no murmur, no gallop Abdomen: soft, good BS, nontender Extremities: no edema Neuro: alert and oriented, no focal motor or sensory deficits  ASSESSMENT/PLAN:  Labs and imaging studies were reviewed  Will continue Invanz. PICC line consult today. Try to arrange for home antibiotics. F/u labs in AM.

## 2015-04-01 NOTE — Progress Notes (Signed)
Took over pt care at 43, pt alert and oriented, calm and cooperative with care, remains on contact isolation, no noted complaints, pt voices any needs

## 2015-04-02 LAB — CBC WITH DIFFERENTIAL/PLATELET
BASOS PCT: 1 %
Basophils Absolute: 0.1 10*3/uL (ref 0–0.1)
Eosinophils Absolute: 0.2 10*3/uL (ref 0–0.7)
Eosinophils Relative: 2 %
HEMATOCRIT: 31.8 % — AB (ref 35.0–47.0)
Hemoglobin: 11.1 g/dL — ABNORMAL LOW (ref 12.0–16.0)
Lymphocytes Relative: 11 %
Lymphs Abs: 1.1 10*3/uL (ref 1.0–3.6)
MCH: 32.8 pg (ref 26.0–34.0)
MCHC: 34.9 g/dL (ref 32.0–36.0)
MCV: 93.8 fL (ref 80.0–100.0)
MONOS PCT: 14 %
Monocytes Absolute: 1.4 10*3/uL — ABNORMAL HIGH (ref 0.2–0.9)
Neutro Abs: 6.9 10*3/uL — ABNORMAL HIGH (ref 1.4–6.5)
Neutrophils Relative %: 72 %
Platelets: 262 10*3/uL (ref 150–440)
RBC: 3.39 MIL/uL — ABNORMAL LOW (ref 3.80–5.20)
RDW: 12.2 % (ref 11.5–14.5)
WBC: 9.7 10*3/uL (ref 3.6–11.0)

## 2015-04-02 LAB — BASIC METABOLIC PANEL
Anion gap: 8 (ref 5–15)
BUN: 14 mg/dL (ref 6–20)
CO2: 26 mmol/L (ref 22–32)
Calcium: 8.9 mg/dL (ref 8.9–10.3)
Chloride: 100 mmol/L — ABNORMAL LOW (ref 101–111)
Creatinine, Ser: 1.3 mg/dL — ABNORMAL HIGH (ref 0.44–1.00)
GFR calc Af Amer: 46 mL/min — ABNORMAL LOW (ref >60)
GFR calc non Af Amer: 40 mL/min — ABNORMAL LOW (ref >60)
GLUCOSE: 109 mg/dL — AB (ref 65–99)
Potassium: 3.6 mmol/L (ref 3.5–5.1)
SODIUM: 134 mmol/L — AB (ref 135–145)

## 2015-04-02 MED ORDER — PANTOPRAZOLE SODIUM 40 MG PO TBEC: 40.0000 mg | DELAYED_RELEASE_TABLET | Freq: Every day | ORAL | Status: AC

## 2015-04-02 MED ORDER — LOSARTAN POTASSIUM 50 MG PO TABS: 50.0000 mg | ORAL_TABLET | Freq: Two times a day (BID) | ORAL | Status: AC

## 2015-04-02 MED ORDER — SODIUM CHLORIDE 0.9 % IV SOLN: 1.0000 g | INTRAVENOUS | Status: AC

## 2015-04-02 MED ORDER — SERTRALINE HCL 50 MG PO TABS: 50.0000 mg | ORAL_TABLET | Freq: Every day | ORAL | Status: AC

## 2015-04-02 MED ORDER — DOXYCYCLINE HYCLATE 100 MG PO TABS: 100.0000 mg | ORAL_TABLET | Freq: Two times a day (BID) | ORAL | Status: AC

## 2015-04-02 MED ORDER — ONDANSETRON HCL 4 MG PO TABS: 4.0000 mg | ORAL_TABLET | Freq: Four times a day (QID) | ORAL | Status: AC | PRN

## 2015-04-02 NOTE — Discharge Summary (Signed)
Emma Walls, is a 74 y.o. female  DOB September 07, 1941  MRN [id number].  Admission date:  03/30/2015  Admitting Physician  Idelle Crouch, MD  Discharge Date:  04/02/2015   Primary MD  Floyed Masoud D, MD  Recommendations for primary care physician for things to follow:   PICC line care F/u UA   Admission Diagnosis  Hyponatremia [E87.1] Pyelonephritis [N12] Non-intractable vomiting with nausea, vomiting of unspecified type [R11.2]   Discharge Diagnosis  Hyponatremia [E87.1] Pyelonephritis [N12] Non-intractable vomiting with nausea, vomiting of unspecified type [R11.2]    Principal Problem:   Pyelonephritis Active Problems:   HTN (hypertension)   Hypothyroidism   GERD without esophagitis   CKD (chronic kidney disease) stage 3, GFR 30-59 ml/min      Past Medical History  Diagnosis Date  . Hypertension   . H/O seasonal allergies   . History of migraine   . Mild memory disturbance OCCASIONAL FORGETFULNESS  . Hypothyroidism   . GERD (gastroesophageal reflux disease)   . Constipation   . Microhematuria   . Impaired hearing BILATERAL HEARING AIDS  . Kidney stones     Past Surgical History  Procedure Laterality Date  . Vaginal hysterectomy  AGE 49  . Benign breast bx  2007  (APPROX)  . Cystoscopy w/ retrogrades  05/16/2012    Procedure: CYSTOSCOPY WITH RETROGRADE PYELOGRAM;  Surgeon: Molli Hazard, MD;  Location: [facility];  Service: Urology;  Laterality: Bilateral;  45 MINUTES    . 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 Kidney stone surgery         History of present illness and  Hospital Course:     Kindly see H&P for history of present illness and admission details, please review complete Labs, Consult reports and Test reports for all details in brief  HPI  from the history and physical done on the day of admission    Hospital Course    Pt admitted with  pyelonephritis found to have ESBL. Feeling better on Invanz. PICC line placed. Will finish IV ABX at home.   Discharge Condition: stable   Follow UP  Follow-up Information    Follow up with Sareena Odeh D, MD In 1 week.   Specialty:  Internal Medicine   Contact information:   [place] [id number] [phone]         Discharge Instructions  and  Discharge Medications   Home Health IV ABX thru PICC line     Medication List    STOP taking these medications        cefUROXime 500 MG tablet  Commonly known as:  CEFTIN     hyoscyamine 0.125 MG tablet  Commonly known as:  LEVSIN, ANASPAZ      TAKE these medications        aspirin EC 81 MG tablet  Take 81 mg by mouth daily.     CALCIUM 500 PO  Take 1 tablet by mouth daily.     cetirizine  10 MG tablet  Commonly known as:  ZYRTEC  Take 10 mg by mouth daily as needed for allergies.     donepezil 5 MG tablet  Commonly known as:  ARICEPT  Take 5 mg by mouth at bedtime.     doxycycline 100 MG tablet  Commonly known as:  VIBRA-TABS  Take 1 tablet (100 mg total) by mouth every 12 (twelve) hours.     ertapenem 1 g in sodium chloride 0.9 % 50 mL  Inject 1 g into the vein daily.     fluticasone 50 MCG/ACT nasal spray  Commonly known as:  FLONASE  Place 2 sprays into both nostrils daily as needed for rhinitis.     levothyroxine 88 MCG tablet  Commonly known as:  SYNTHROID, LEVOTHROID  Take 88 mcg by mouth daily.     losartan 50 MG tablet  Commonly known as:  COZAAR  Take 1 tablet (50 mg total) by mouth 2 (two) times daily.     metoprolol succinate 50 MG 24 hr tablet  Commonly known as:  TOPROL-XL  Take 50 mg by mouth daily.     MULTIVITAMIN PO  Take 15 mLs by mouth daily.     ondansetron 4 MG tablet  Commonly known as:  ZOFRAN  Take 1 tablet (4 mg total) by mouth every 6 (six) hours as needed for nausea.     pantoprazole 40 MG tablet  Commonly known as:   PROTONIX  Take 1 tablet (40 mg total) by mouth daily.     sertraline 50 MG tablet  Commonly known as:  ZOLOFT  Take 1 tablet (50 mg total) by mouth at bedtime.          Diet and Activity recommendation: See Discharge Instructions above   Consults obtained - none   Major procedures and Radiology Reports - PLEASE review detailed and final reports for all details, in brief -   See below   Ct Abdomen Pelvis Wo Contrast  03/30/2015   CLINICAL DATA:  Patient being treated for kidney infection since last Tuesday with worsening bilateral flank pain. Vomiting with chills/sweats and headache.  EXAM: CT ABDOMEN AND PELVIS WITHOUT CONTRAST  TECHNIQUE: Multidetector CT imaging of the abdomen and pelvis was performed following the standard protocol without IV contrast.  COMPARISON:  04/24/2012 and 03/11/2012  FINDINGS: Lung bases are within normal.  Abdominal images demonstrate a normal liver, spleen, gallbladder and adrenal glands. Pancreas demonstrates very subtle ill definition of the fat planes near the head of the pancreas and posterior to the distal body of the pancreas likely related to patient's presumed kidney infection. However, cannot completely exclude early acute pancreatitis. Appendix is normal. There is minimal calcified plaque over the abdominal aorta.  Kidneys are normal in size without hydronephrosis or nephrolithiasis. There is subtle stranding of the perinephric fat bilaterally. Ureters are unremarkable.  Pelvic images demonstrate the bladder and rectosigmoid colon to be within normal ovaries are normal. There is a small amount of free fluid present. Surgical absence of the uterus. There is mild degenerate changes spine with moderate disc space narrowing at the L4-5 level unchanged.  IMPRESSION: Subtle bilateral stranding of the perinephric fat which may be related to patient's reported pyelonephritis. Sub cm hypodensity over the mid pole cortex of the left kidney which may represent a  cyst, although is too small to characterize. Alternatively this may represent part of patient's infection.  Subtle ill definition of the peripancreatic fat planes likely secondary to the presumed adjacent  kidney infection, although cannot completely exclude mild acute pancreatitis.   Electronically Signed   By: Marin Olp M.D.   On: 03/30/2015 21:25   Dg Chest 1 View  04/01/2015   CLINICAL DATA:  PICC line placement. Pyelonephritis and chronic kidney disease.  EXAM: CHEST  1 VIEW  COMPARISON:  None.  FINDINGS: A right arm PICC line is seen with tip overlying the superior cavoatrial junction.  Heart size is within normal limits allowing for portable technique. No evidence of pleural effusion or pneumothorax.  Asymmetric opacity is seen in the medial right lung apex, and pulmonary neoplasm or infiltrate cannot be excluded.  IMPRESSION: Right arm PICC line in appropriate position.  Asymmetric opacity in medial right lung apex. Recommend chest CT to exclude pulmonary neoplasm or infiltrate.   Electronically Signed   By: Earle Gell M.D.   On: 04/01/2015 14:27   US Carotid Bilateral  03/17/2015   CLINICAL DATA:  Near syncopal episode, hypertension, TIA symptoms, hyperlipidemia.  EXAM: BILATERAL CAROTID DUPLEX ULTRASOUND  TECHNIQUE: Pearline Cables scale imaging, color Doppler and duplex ultrasound were performed of bilateral carotid and vertebral arteries in the neck.  COMPARISON:  None.  FINDINGS: Criteria: Quantification of carotid stenosis is based on velocity parameters that correlate the residual internal carotid diameter with NASCET-based stenosis levels, using the diameter of the distal internal carotid lumen as the denominator for stenosis measurement.  The following velocity measurements were obtained:  RIGHT  ICA:  71/28 cm/sec  CCA:  0000000 cm/sec  SYSTOLIC ICA/CCA RATIO:  1.0  DIASTOLIC ICA/CCA RATIO:  0.9  ECA:  49 cm/sec  LEFT  ICA:  74/25 cm/sec  CCA:  Q000111Q cm/sec  SYSTOLIC ICA/CCA RATIO:  0.9  DIASTOLIC  ICA/CCA RATIO:  0.9  ECA:  67 cm/sec  RIGHT CAROTID ARTERY: Mild echogenic shadowing plaque formation. No hemodynamically significant right ICA stenosis, velocity elevation, or turbulent flow. Degree of narrowing less than 50%.  RIGHT VERTEBRAL ARTERY:  Antegrade  LEFT CAROTID ARTERY: Similar scattered mild echogenic plaque formation. No hemodynamically significant left ICA stenosis, velocity elevation, or turbulent flow.  LEFT VERTEBRAL ARTERY:  Antegrade  IMPRESSION: Mild bilateral carotid atherosclerosis. No hemodynamically significant ICA stenosis by ultrasound. Degree of narrowing less than 50% bilaterally.   Electronically Signed   By: Jerilynn Mages.  Shick M.D.   On: 03/17/2015 15:57    Micro Results   See below  Recent Results (from the past 240 hour(s))  Urine culture     Status: None   Collection Time: 03/28/15  9:45 PM  Result Value Ref Range Status   Specimen Description Urine  Final   Special Requests NONE  Final   Culture   Final    >100000 ESCHERICHIA COLI Results Called to: ERIN PARDINI AT [id number] 03/31/15 DV ESBL-EXTENDED SPECTRUM BETA LACTAMASE-THE ORGANISM IS RESISTANT TO PENICILLINS, CEPHALOSPORINS AND AZTREONAM ACCORDING TO CLSI M100-S15 VOL.[place].    Report Status 03/31/2015 FINAL  Final       Today   Subjective:   Arbutus Leas today has no headache,no chest abdominal pain,no new weakness tingling or numbness, feels much better wants to go home today.   Objective:   Blood pressure 163/77, pulse 86, temperature 98.2 F (36.8 C), temperature source Oral, resp. rate 18, height 5\' 1"  (1.549 m), weight 65.273 kg (143 lb 14.4 oz), SpO2 100 %.   Intake/Output Summary (Last 24 hours) at 04/02/15 0739 Last data filed at 04/01/15 2123  Gross per 24 hour  Intake  0 ml  Output    900 ml  Net   -900 ml    Exam Awake Alert, Oriented x 3, No new F.N deficits, Normal affect [place].AT,PERRAL Supple Neck,No JVD, No cervical lymphadenopathy appriciated.  Symmetrical Chest  wall movement, Good air movement bilaterally, CTAB RRR,No Gallops,Rubs or new Murmurs, No Parasternal Heave +ve B.Sounds, Abd Soft, Non tender, No organomegaly appriciated, No rebound -guarding or rigidity. No Cyanosis, Clubbing or edema, No new Rash or bruise  Data Review   CBC w Diff: Lab Results  Component Value Date   WBC 9.7 04/02/2015   HGB 11.1* 04/02/2015   HCT 31.8* 04/02/2015   PLT 262 04/02/2015   LYMPHOPCT 11 04/02/2015   BANDSPCT 3 04/01/2015   MONOPCT 14 04/02/2015   EOSPCT 2 04/02/2015   BASOPCT 1 04/02/2015    CMP: Lab Results  Component Value Date   NA 134* 04/02/2015   K 3.6 04/02/2015   CL 100* 04/02/2015   CO2 26 04/02/2015   BUN 14 04/02/2015   CREATININE 1.30* 04/02/2015   PROT 6.9 03/30/2015   ALBUMIN 3.0* 03/30/2015   BILITOT 0.6 03/30/2015   ALKPHOS 117 03/30/2015   AST 42* 03/30/2015   ALT 65* 03/30/2015  .   Total Time in preparing paper work, data evaluation and todays exam - 63 minutes  Shay Jhaveri D M.D on 04/02/2015 at 7:39 AM

## 2015-04-02 NOTE — Plan of Care (Signed)
Problem: Discharge Progression Outcomes Goal: Other Discharge Outcomes/Goals Outcome: Progressing Plan of care progress to goals:  1. Tylenol 650mg  oral given for headache per patient request. Improvement noted. 2. Hemodynamically:        Afebrile.       Labs Improving.       Remains on Invanz antibiotics. 3. Complications: Contact precautions for ESBL in urine. 4. Diet: Tolerating regular diet. No N/V noted. 5. Activity:        Standby assist.       No falls or injuries noted this shift.

## 2015-04-02 NOTE — Plan of Care (Signed)
Problem: Discharge Progression Outcomes Goal: Discharge plan in place and appropriate Individualization Outcome: Progressing Patient goes by Emma Walls, lives at home with husband. 8 days ago reports that she had a tick bite her rt shoulder, small scab is visible.  Hx of hypertension,seasonal allergies, migraines, Mild memory disturb ance  with occasional  forgetfulness, Hypothyroidism, GERD, Constipation, Microhematuria, Impaired hearing, and Kidney stones. Pt is on home medications     Goal: Other Discharge Outcomes/Goals Outcome: Progressing Pt alert and oriented, calls out for any assistance needed. No complaints of pain, nausea or discomfort. Pt resting comfortably.

## 2015-04-02 NOTE — Care Management (Signed)
Spoke with Estrella Myrtle, representative for [place]. Colbert Ewing is not on the [place].  This drug will need to be preapproved thru insurance before being given in the home. Updated given to Dr. Doy Hutching. Telephone call to [phone]. Re-directed to [phone] option # 4, #1. Recording in place at [facility] stating that "This department is closed due to an departmental event. " Will attempt to call again this afternoon.  Ms. Trank updated. Will update Dr. Doy Hutching also. Attentive plan: [facility] will be giving IV antibiotic in the home after approval has been obtained.  [facility] will be doing nursing services. Shelbie Ammons RN MSN Care Management [phone]

## 2015-04-02 NOTE — Discharge Instructions (Signed)
Home health Pyelonephritis, Adult Pyelonephritis is a kidney infection. In general, there are 2 main types of pyelonephritis:  Infections that come on quickly without any warning (acute pyelonephritis).  Infections that persist for a long period of time (chronic pyelonephritis). CAUSES  Two main causes of pyelonephritis are:  Bacteria traveling from the bladder to the kidney. This is a problem especially in pregnant women. The urine in the bladder can become filled with bacteria from multiple causes, including:  Inflammation of the prostate gland (prostatitis).  Sexual intercourse in females.  Bladder infection (cystitis).  Bacteria traveling from the bloodstream to the tissue part of the kidney. Problems that may increase your risk of getting a kidney infection include:  Diabetes.  Kidney stones or bladder stones.  Cancer.  Catheters placed in the bladder.  Other abnormalities of the kidney or ureter. SYMPTOMS   Abdominal pain.  Pain in the side or flank area.  Fever.  Chills.  Upset stomach.  Blood in the urine (dark urine).  Frequent urination.  Strong or persistent urge to urinate.  Burning or stinging when urinating. DIAGNOSIS  Your caregiver may diagnose your kidney infection based on your symptoms. A urine sample may also be taken. TREATMENT  In general, treatment depends on how severe the infection is.   If the infection is mild and caught early, your caregiver may treat you with oral antibiotics and send you home.  If the infection is more severe, the bacteria may have gotten into the bloodstream. This will require intravenous (IV) antibiotics and a hospital stay. Symptoms may include:  High fever.  Severe flank pain.  Shaking chills.  Even after a hospital stay, your caregiver may require you to be on oral antibiotics for a period of time.  Other treatments may be required depending upon the cause of the infection. HOME CARE INSTRUCTIONS     Take your antibiotics as directed. Finish them even if you start to feel better.  Make an appointment to have your urine checked to make sure the infection is gone.  Drink enough fluids to keep your urine clear or pale yellow.  Take medicines for the bladder if you have urgency and frequency of urination as directed by your caregiver. SEEK IMMEDIATE MEDICAL CARE IF:   You have a fever or persistent symptoms for more than 2-3 days.  You have a fever and your symptoms suddenly get worse.  You are unable to take your antibiotics or fluids.  You develop shaking chills.  You experience extreme weakness or fainting.  There is no improvement after 2 days of treatment. MAKE SURE YOU:  Understand these instructions.  Will watch your condition.  Will get help right away if you are not doing well or get worse. Document Released: 10/17/2005 Document Revised: 04/17/2012 Document Reviewed: 03/23/2011 [facility] Patient Information 2015 [place], [place]. This information is not intended to replace advice given to you by your health care provider. Make sure you discuss any questions you have with your health care provider.

## 2015-04-02 NOTE — Care Management (Signed)
2:19 pm: Telephone call to [phone] option 4, 1. Rerouted to [employer] [phone]. Rerouted to [employer] [phone]. Rerouted to [place], then finally rerouted to [place]. Finally went back on line and electronically submitted to Medicare [place]. Possible determination 24-72 hours. 3:49pm (last telephone call) Shelbie Ammons RN MSN Care Management [phone]

## 2015-04-03 NOTE — Plan of Care (Signed)
Problem: Discharge Progression Outcomes Goal: Discharge plan in place and appropriate Individualization Patient goes by Emma Walls, lives at home with husband. 8 days ago reports that she had a tick bite her rt shoulder, small scab is visible.  Hx of hypertension,seasonal allergies, migraines, Mild memory disturb ance  with occasional  forgetfulness, Hypothyroidism, GERD, Constipation, Microhematuria, Impaired hearing, and Kidney stones. Pt is on home medications      Goal: Other Discharge Outcomes/Goals Plan of care progress to goals: Afebrile. Tylenol givenx2 for headache with improvement. Continue Invanz and doxycycline. Contact precautions maintained.

## 2015-04-03 NOTE — Care Management (Signed)
Informed that [employer] will alert Care Management by fax when a prior authorization requested is approved. The fax number of [phone] was placed on submitted request. This is the  oncology unit fax's number. Will update staff on 1c to let case management know when this fax comes across the fax machine.  Also, information states it may take 1-5 business days for the plan to come to a determination.  When this determination has been made, [place] will be notified concerning Invanz prescription per case management.  Ms. Corter updated. Shelbie Ammons RN MSN Care Management [phone]

## 2015-04-03 NOTE — Progress Notes (Signed)
Katelen Winchel is a 74 y.o. female  Pyelonephritis   SUBJECTIVE:  Pt still here awaiting insurance approval for home IV ABX. No complaints except HA. Afebrile. Eating well.  ______________________________________________________________________  ROS: Review of systems is unremarkable for any active cardiac,respiratory, GI, GU, hematologic, neurologic or psychiatric systems, 10 systems reviewed.  @CMEDLIST @  Past Medical History  Diagnosis Date  . Hypertension   . H/O seasonal allergies   . History of migraine   . Mild memory disturbance OCCASIONAL FORGETFULNESS  . Hypothyroidism   . GERD (gastroesophageal reflux disease)   . Constipation   . Microhematuria   . Impaired hearing BILATERAL HEARING AIDS  . Kidney stones     Past Surgical History  Procedure Laterality Date  . Vaginal hysterectomy  AGE 55  . Benign breast bx  2007  (APPROX)  . Cystoscopy w/ retrogrades  05/16/2012    Procedure: CYSTOSCOPY WITH RETROGRADE PYELOGRAM;  Surgeon: Molli Hazard, MD;  Location: [facility];  Service: Urology;  Laterality: Bilateral;  45 MINUTES    . 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 Kidney stone surgery      PHYSICAL EXAM:  BP 161/81 mmHg  Pulse 74  Temp(Src) 98.2 F (36.8 C) (Oral)  Resp 20  Ht 5\' 1"  (1.549 m)  Wt 65.273 kg (143 lb 14.4 oz)  BMI 27.20 kg/m2  SpO2 96%  Wt Readings from Last 3 Encounters:  03/31/15 65.273 kg (143 lb 14.4 oz)  03/28/15 62.596 kg (138 lb)  05/10/12 62.596 kg (138 lb)            Constitutional: NAD Neck: supple, no thyromegaly Respiratory: CTA, no rales or wheezes Cardiovascular: RRR, no murmur, no gallop Abdomen: soft, good BS, nontender Extremities: no edema Neuro: alert and oriented, no focal motor or sensory deficits  ASSESSMENT/PLAN:  Labs and imaging studies were reviewed  Will continue IV Invanz. F/u with insurance company today. Repeat labs in AM if still here.

## 2015-04-03 NOTE — Progress Notes (Signed)
MD making rounds. Discharge orders received. Emma Walls, case Government social research officer for [employer] for home distribution. Spoke with Emma Walls, medication approved. Spoke with patient ready for discharge. IV removed. RUA PICC line intact for discharge home. Prescriptions given to patient. Discharge paperwork provided, explained, signed and witnessed. No unanswered questions. Discharged via wheelchair by nursing staff. Belongings sent with patient and family.

## 2015-04-03 NOTE — Care Management (Signed)
Telephone call to [phone] after hours on call nurse for home health. Left voice mail for [employer]. States that all information has been faxed to intake. A representative had accepted Ms. Locher for services. [facility] will be providing the [place], but [facility] would need to provide the nursing services. Left my telephone number for any questions Shelbie Ammons RN MSN Care Management [phone]

## 2015-04-03 NOTE — Care Management (Signed)
Received telephone call from [place] at [facility] at 4:50pm. Ms. Emma Walls has been approved for Invanz. Approved from 04/03/15-04/05/16. [employer], representative for [facility] updated. Will need Invanz at 1:00pm tomorrow. Dr. Doy Hutching updated. Discharged Ms. Emma Walls home today. Will fax medical neccessity form, Home Health orders, history & physical, and face sheet to [facility]. [phone]). Will call [facility] with update. Nurse Emma Walls updated to discharge Ms. Emma Walls.  Shelbie Ammons RN MSN Care Management [phone]

## 2015-09-22 ENCOUNTER — Other Ambulatory Visit: Payer: Self-pay | Admitting: Internal Medicine

## 2015-09-22 ENCOUNTER — Ambulatory Visit
Admission: RE | Admit: 2015-09-22 | Discharge: 2015-09-22 | Disposition: A | Discharge status: Home / Self Care | Payer: Medicare Other | Source: Ambulatory Visit | Attending: Internal Medicine | Admitting: Internal Medicine

## 2015-09-22 DIAGNOSIS — M79661 Pain in right lower leg: Secondary | ICD-10-CM

## 2015-10-19 ENCOUNTER — Other Ambulatory Visit: Payer: Self-pay | Admitting: Obstetrics and Gynecology

## 2015-10-19 DIAGNOSIS — Z1231 Encounter for screening mammogram for malignant neoplasm of breast: Secondary | ICD-10-CM

## 2015-10-28 ENCOUNTER — Ambulatory Visit
Admission: RE | Admit: 2015-10-28 | Discharge: 2015-10-28 | Disposition: A | Discharge status: Home / Self Care | Payer: Medicare Other | Source: Ambulatory Visit | Attending: Obstetrics and Gynecology | Admitting: Obstetrics and Gynecology

## 2015-10-28 DIAGNOSIS — Z1231 Encounter for screening mammogram for malignant neoplasm of breast: Secondary | ICD-10-CM | POA: Diagnosis not present

## 2016-09-20 ENCOUNTER — Other Ambulatory Visit: Payer: Self-pay | Admitting: Internal Medicine

## 2016-09-20 DIAGNOSIS — Z1231 Encounter for screening mammogram for malignant neoplasm of breast: Secondary | ICD-10-CM

## 2016-11-03 ENCOUNTER — Ambulatory Visit: Payer: Medicare Other

## 2016-12-05 ENCOUNTER — Ambulatory Visit: Payer: Medicare Other

## 2017-01-02 ENCOUNTER — Other Ambulatory Visit: Payer: Self-pay | Admitting: Internal Medicine

## 2017-01-02 ENCOUNTER — Ambulatory Visit
Admission: RE | Admit: 2017-01-02 | Discharge: 2017-01-02 | Disposition: A | Discharge status: Home / Self Care | Payer: Medicare HMO | Source: Ambulatory Visit | Attending: Internal Medicine | Admitting: Internal Medicine

## 2017-01-02 DIAGNOSIS — Z1231 Encounter for screening mammogram for malignant neoplasm of breast: Secondary | ICD-10-CM | POA: Insufficient documentation

## 2017-01-02 DIAGNOSIS — I10 Essential (primary) hypertension: Secondary | ICD-10-CM

## 2017-01-06 ENCOUNTER — Ambulatory Visit: Payer: Medicare Other

## 2017-01-06 ENCOUNTER — Ambulatory Visit
Admission: RE | Admit: 2017-01-06 | Discharge: 2017-01-06 | Disposition: A | Discharge status: Home / Self Care | Payer: Medicare HMO | Source: Ambulatory Visit | Attending: Internal Medicine | Admitting: Internal Medicine

## 2017-01-06 DIAGNOSIS — I10 Essential (primary) hypertension: Secondary | ICD-10-CM | POA: Insufficient documentation

## 2018-01-24 ENCOUNTER — Other Ambulatory Visit: Payer: Self-pay | Admitting: Internal Medicine

## 2018-01-24 DIAGNOSIS — Z1231 Encounter for screening mammogram for malignant neoplasm of breast: Secondary | ICD-10-CM

## 2018-02-14 ENCOUNTER — Ambulatory Visit
Admission: RE | Admit: 2018-02-14 | Discharge: 2018-02-14 | Disposition: A | Discharge status: Home / Self Care | Payer: Medicare HMO | Source: Ambulatory Visit | Attending: Internal Medicine | Admitting: Internal Medicine

## 2018-02-14 DIAGNOSIS — Z1231 Encounter for screening mammogram for malignant neoplasm of breast: Secondary | ICD-10-CM | POA: Diagnosis not present

## 2018-06-29 ENCOUNTER — Ambulatory Visit: Admit: 2018-06-29 | Payer: Medicare Other | Admitting: Unknown Physician Specialty

## 2018-06-29 SURGERY — COLONOSCOPY WITH PROPOFOL
Anesthesia: General

## 2018-09-19 ENCOUNTER — Other Ambulatory Visit: Payer: Self-pay | Admitting: Unknown Physician Specialty

## 2018-09-19 DIAGNOSIS — H5710 Ocular pain, unspecified eye: Secondary | ICD-10-CM

## 2018-09-19 DIAGNOSIS — G500 Trigeminal neuralgia: Secondary | ICD-10-CM

## 2018-09-21 ENCOUNTER — Ambulatory Visit: Admit: 2018-09-21 | Payer: Medicare Other | Admitting: Unknown Physician Specialty

## 2018-09-21 SURGERY — COLONOSCOPY WITH PROPOFOL
Anesthesia: General

## 2018-09-26 ENCOUNTER — Ambulatory Visit
Admission: RE | Admit: 2018-09-26 | Discharge: 2018-09-26 | Disposition: A | Discharge status: Home / Self Care | Payer: Medicare HMO | Source: Ambulatory Visit | Attending: Unknown Physician Specialty | Admitting: Unknown Physician Specialty

## 2018-09-26 DIAGNOSIS — H5710 Ocular pain, unspecified eye: Secondary | ICD-10-CM

## 2018-09-26 DIAGNOSIS — G500 Trigeminal neuralgia: Secondary | ICD-10-CM | POA: Diagnosis not present

## 2018-09-26 HISTORY — DX: Malignant neoplasm of cervix uteri, unspecified: C53.9

## 2018-09-26 LAB — POCT I-STAT CREATININE: CREATININE: 1.8 mg/dL — AB (ref 0.44–1.00)

## 2018-09-26 MED ORDER — IOPAMIDOL (ISOVUE-300) INJECTION 61%: 75.0000 mL | Freq: Once | INTRAVENOUS | Status: DC | PRN

## 2018-10-19 ENCOUNTER — Other Ambulatory Visit: Payer: Self-pay | Admitting: Nephrology

## 2018-10-19 DIAGNOSIS — N184 Chronic kidney disease, stage 4 (severe): Secondary | ICD-10-CM

## 2018-10-19 DIAGNOSIS — N179 Acute kidney failure, unspecified: Secondary | ICD-10-CM

## 2018-11-08 ENCOUNTER — Ambulatory Visit
Admission: RE | Admit: 2018-11-08 | Discharge: 2018-11-08 | Disposition: A | Discharge status: Home / Self Care | Payer: Medicare HMO | Source: Ambulatory Visit | Attending: Nephrology | Admitting: Nephrology

## 2018-11-08 DIAGNOSIS — N179 Acute kidney failure, unspecified: Secondary | ICD-10-CM | POA: Insufficient documentation

## 2018-11-08 DIAGNOSIS — N184 Chronic kidney disease, stage 4 (severe): Secondary | ICD-10-CM | POA: Insufficient documentation

## 2019-02-06 ENCOUNTER — Other Ambulatory Visit: Payer: Self-pay | Admitting: Internal Medicine

## 2019-02-06 DIAGNOSIS — Z1231 Encounter for screening mammogram for malignant neoplasm of breast: Secondary | ICD-10-CM

## 2019-03-28 ENCOUNTER — Ambulatory Visit
Admission: RE | Admit: 2019-03-28 | Discharge: 2019-03-28 | Disposition: A | Discharge status: Home / Self Care | Payer: Medicare HMO | Source: Ambulatory Visit | Attending: Internal Medicine | Admitting: Internal Medicine

## 2019-03-28 ENCOUNTER — Other Ambulatory Visit: Payer: Self-pay

## 2019-03-28 DIAGNOSIS — Z1231 Encounter for screening mammogram for malignant neoplasm of breast: Secondary | ICD-10-CM | POA: Insufficient documentation

## 2019-08-11 IMAGING — MG DIGITAL SCREENING BILATERAL MAMMOGRAM WITH TOMO AND CAD
6 of 10 series · 6 of 30 positions shown · non-contrast
Comparison: Previous exam(s).

CLINICAL DATA: Screening.

EXAM:
DIGITAL SCREENING BILATERAL MAMMOGRAM WITH TOMO AND CAD

[L MLO synth-2D (1 of 2)]
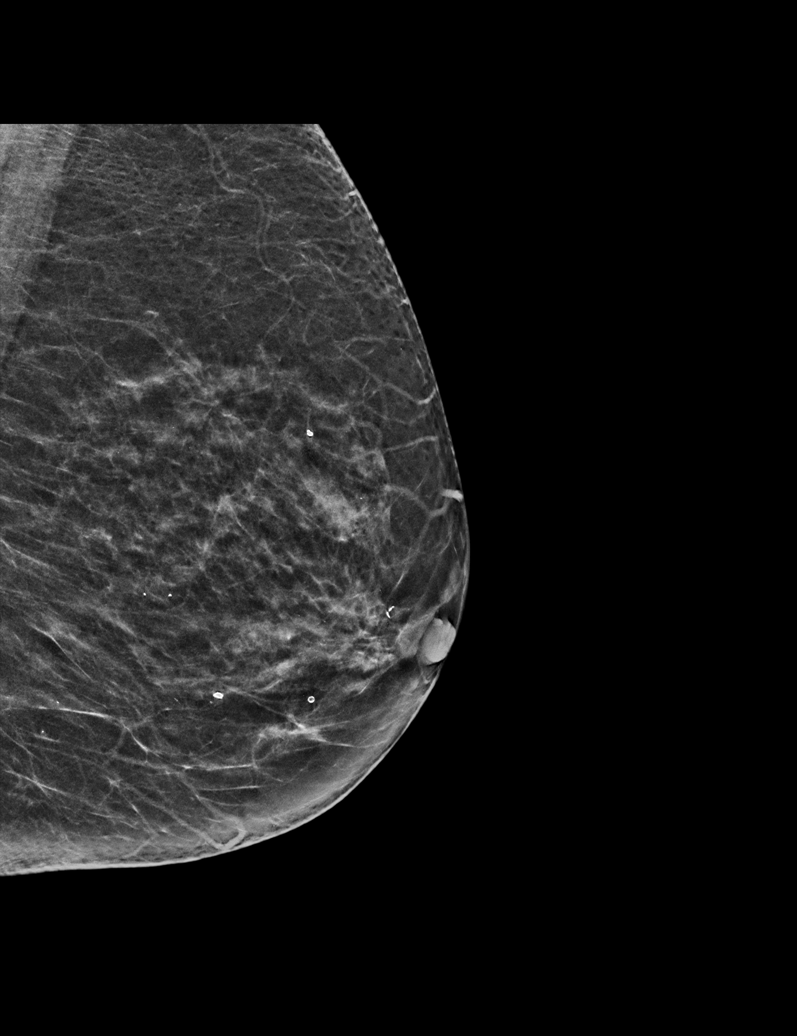

[L MLO synth-2D (2 of 2)]
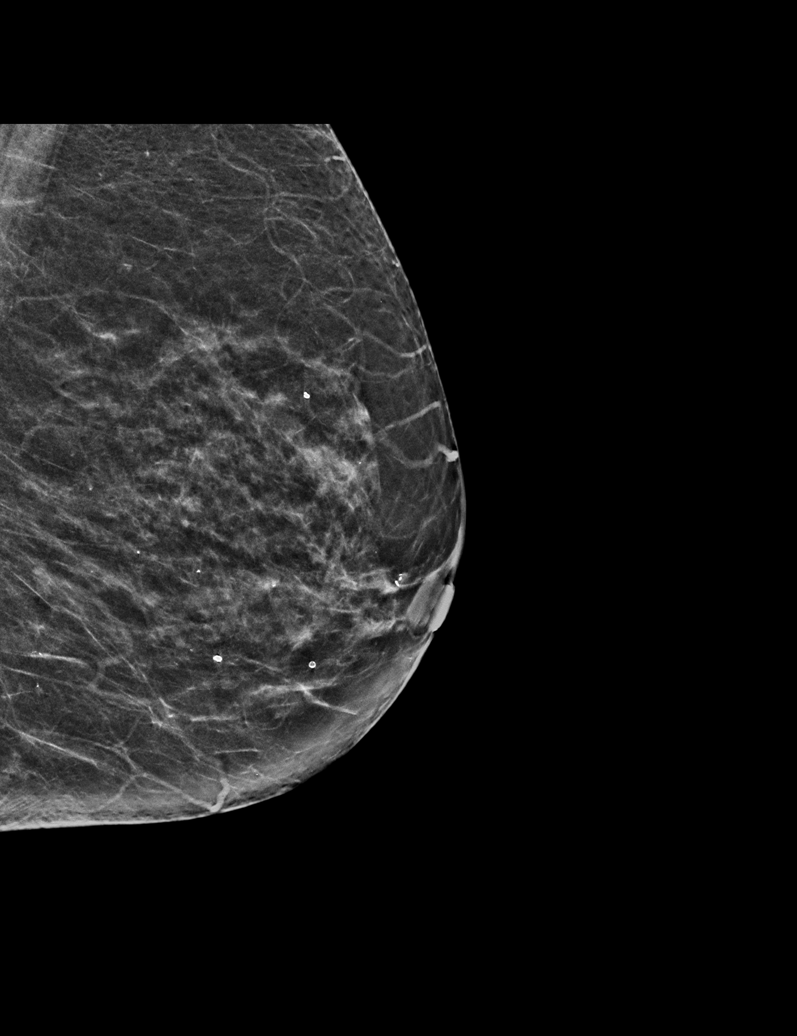

[R MLO synth-2D]
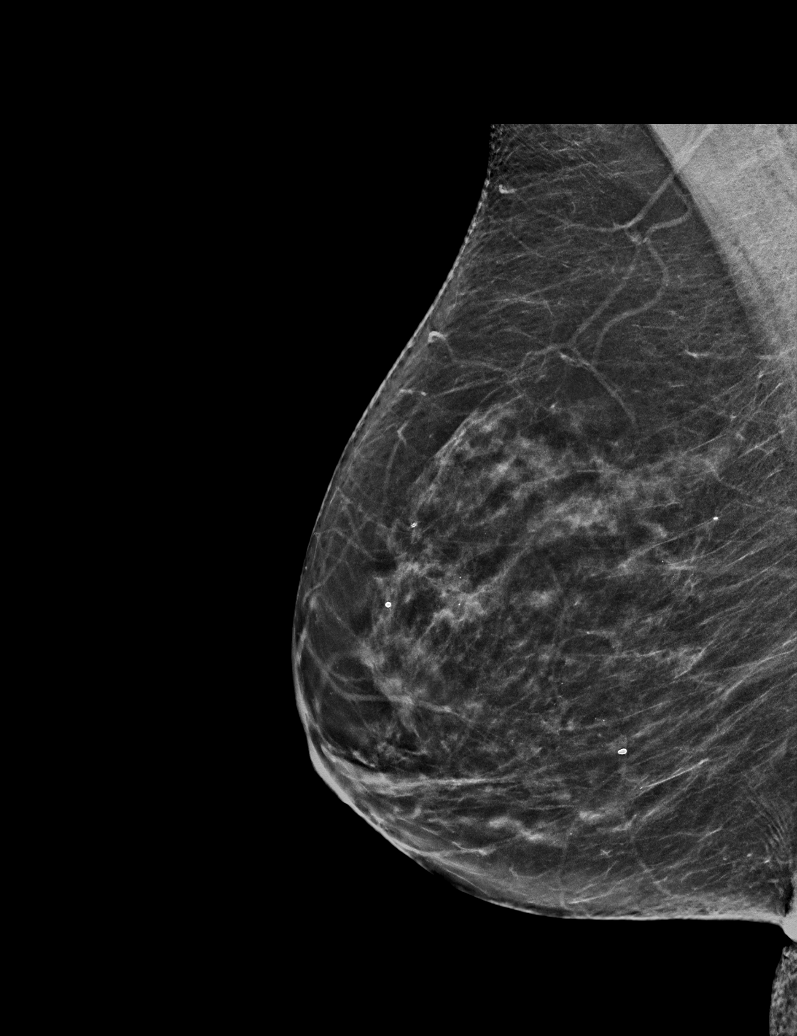

[R CC synth-2D]
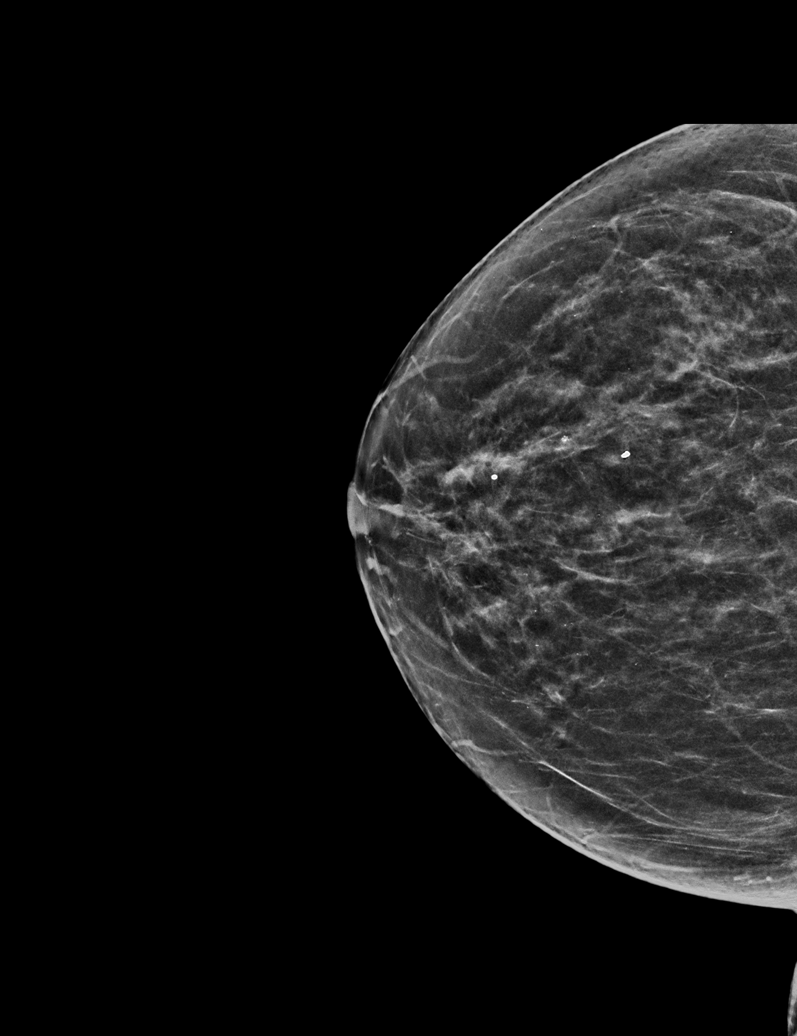

[L CC synth-2D]
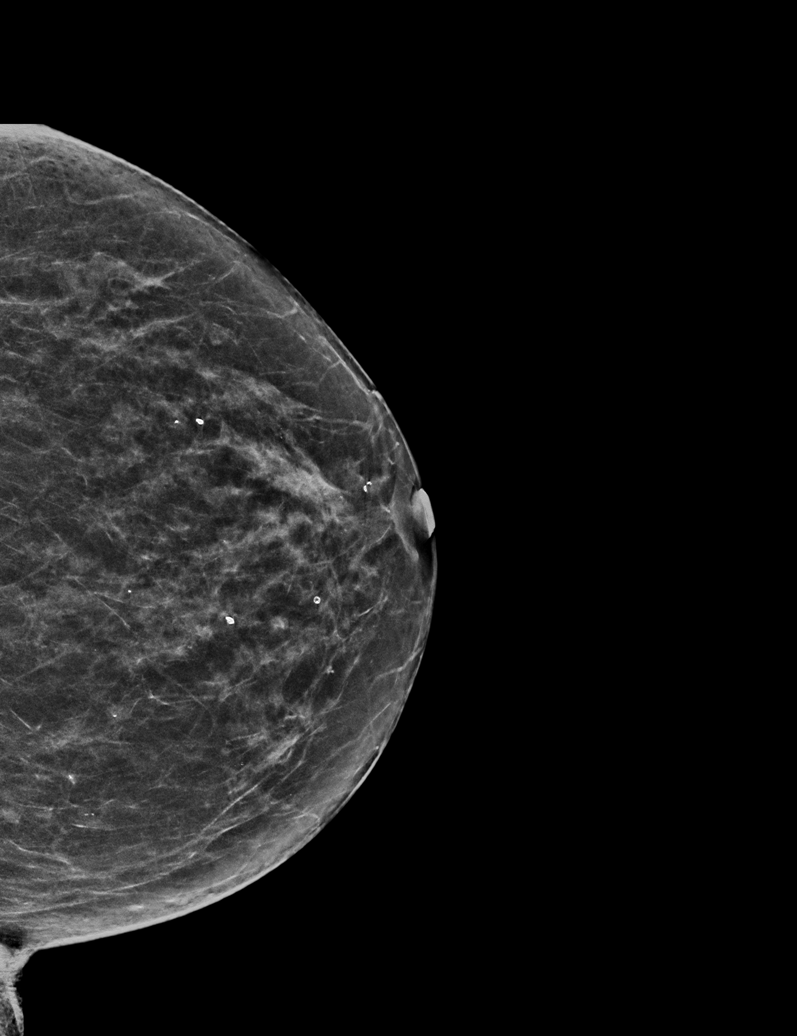

[R CC tomo · tomo slice 28/55.0]
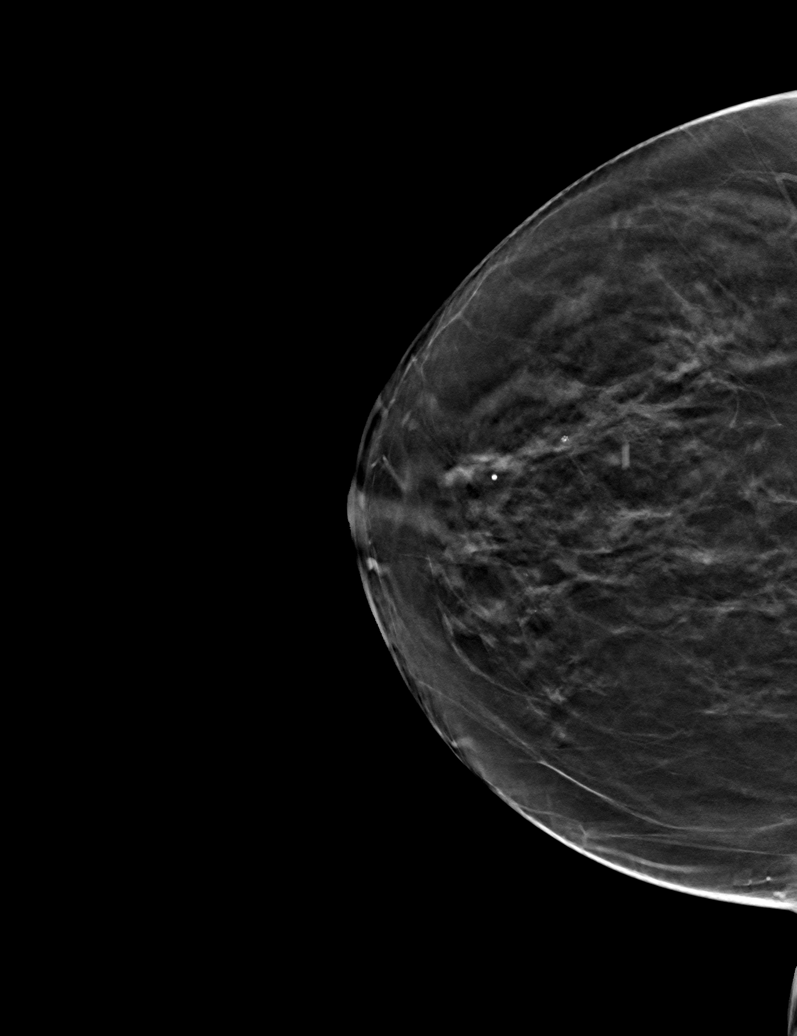

[6 of 30 positions shown; findings below may reference images not displayed]

ACR Breast Density Category b: There are scattered areas of
fibroglandular density.
FINDINGS: There are no findings suspicious for malignancy. Images were
processed with CAD.
IMPRESSION: No mammographic evidence of malignancy. A result letter of this
screening mammogram will be mailed directly to the patient.

RECOMMENDATION:
Screening mammogram in one year. (Code:[id number])

BI-RADS CATEGORY  1: Negative.
